# Patient Record
Sex: Female | Born: 2000 | Race: Black or African American | Hispanic: No | Marital: Single | Smoking: Never smoker
Health system: Southern US, Community
[De-identification: ages and names within clinical notes are randomized; demographics above are authoritative.]

## PROBLEM LIST (undated history)

## (undated) DIAGNOSIS — F419 Anxiety disorder, unspecified: Secondary | ICD-10-CM

## (undated) DIAGNOSIS — K219 Gastro-esophageal reflux disease without esophagitis: Secondary | ICD-10-CM

## (undated) DIAGNOSIS — Z8659 Personal history of other mental and behavioral disorders: Secondary | ICD-10-CM

## (undated) DIAGNOSIS — K589 Irritable bowel syndrome without diarrhea: Secondary | ICD-10-CM

## (undated) DIAGNOSIS — L309 Dermatitis, unspecified: Secondary | ICD-10-CM

## (undated) DIAGNOSIS — K602 Anal fissure, unspecified: Secondary | ICD-10-CM

## (undated) DIAGNOSIS — J45909 Unspecified asthma, uncomplicated: Secondary | ICD-10-CM

## (undated) HISTORY — DX: Dermatitis, unspecified: L30.9

## (undated) HISTORY — PX: ADENOIDECTOMY: SHX5191

## (undated) HISTORY — PX: WISDOM TOOTH EXTRACTION: SHX21

## (undated) HISTORY — PX: TONSILLECTOMY: SUR1361

## (undated) HISTORY — DX: Personal history of other mental and behavioral disorders: Z86.59

## (undated) HISTORY — DX: Anal fissure, unspecified: K60.2

## (undated) HISTORY — DX: Gastro-esophageal reflux disease without esophagitis: K21.9

## (undated) HISTORY — PX: ABCESS DRAINAGE: SHX399

## (undated) HISTORY — DX: Anxiety disorder, unspecified: F41.9

---
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
## 2019-01-01 ENCOUNTER — Telehealth: Payer: Self-pay

## 2019-01-01 NOTE — Telephone Encounter (Signed)
Returned call, pt had nexplanon inserted Oct 2018 to control heavy, irregular periods. Pt states that within the last year cycles have been getting heavy again and are irregular throughout the month. Pt states that she wants to keep her nexplanon for now, but wants to discuss options to control bleeding with provider. Advised scheduler will call for appt.

## 2019-01-13 ENCOUNTER — Encounter: Payer: Self-pay | Admitting: Obstetrics and Gynecology

## 2019-01-13 ENCOUNTER — Ambulatory Visit (INDEPENDENT_AMBULATORY_CARE_PROVIDER_SITE_OTHER): Payer: Medicaid Other | Admitting: Obstetrics and Gynecology

## 2019-01-13 DIAGNOSIS — Z975 Presence of (intrauterine) contraceptive device: Secondary | ICD-10-CM

## 2019-01-13 DIAGNOSIS — N921 Excessive and frequent menstruation with irregular cycle: Secondary | ICD-10-CM

## 2019-01-13 MED ORDER — NORGESTIMATE-ETH ESTRADIOL 0.25-35 MG-MCG PO TABS: 1.0000 | ORAL_TABLET | Freq: Every day | ORAL | 4 refills | Status: DC

## 2019-01-13 NOTE — Progress Notes (Signed)
S/w pt to prep for webex visit. Pt states that nexplanon was place in 2018 for heavy, irregular bleeding, within last year cycles are returning and are lasting longer. Pt does not want nexplanon removed, but wants to discuss options.

## 2019-01-13 NOTE — Progress Notes (Signed)
    TELEHEALTH GYNECOLOGY VIRTUAL VIDEO VISIT ENCOUNTER NOTE  Provider location: [facility] [place]   I connected with Patricia Davenport on 01/13/19 at  4:15 PM EDT by WebEx Video Encounter at home and verified that I am speaking with the correct person using two identifiers.   I discussed the limitations, risks, security and privacy concerns of performing an evaluation and management service by telephone and the availability of in person appointments. I also discussed with the patient that there may be a patient responsible charge related to this service. The patient expressed understanding and agreed to proceed.   History:  Patricia Davenport is a 18 y.o. G0P0000 female being evaluated today for DUB associated with Nexplanon. Patient reports the return of heavy irregular cycle lasting 2 weeks long. Nexplanon was placed 04/2017. She is sexually active. She denies any abnormal vaginal discharge, pelvic pain, or other concerns.       Past Medical History:  Diagnosis Date  . Anxiety   . Asthma    childhood  . Eczema   . History of anxiety    Past Surgical History:  Procedure Laterality Date  . ABCESS DRAINAGE    . ADENOIDECTOMY    . TONSILLECTOMY    . WISDOM TOOTH EXTRACTION     The following portions of the patient's history were reviewed and updated as appropriate: allergies, current medications, past family history, past medical history, past social history, past surgical history and problem list.    Review of Systems:  Pertinent items noted in HPI and remainder of comprehensive ROS otherwise negative.  Physical Exam:   General:  Alert, oriented and cooperative. Patient appears to be in no acute distress.  Mental Status: Normal mood and affect. Normal behavior. Normal judgment and thought content.   Respiratory: Normal respiratory effort, no problems with respiration noted  Rest of physical exam deferred due to type of encounter  Labs and Imaging No results found  for this or any previous visit (from the past 336 hour(s)). No results found.     Assessment and Plan:     DUB with Nexplanon.     Discussed irregular bleeding pattern expected with Nexplanon Rx Sprintec provided to offer some regularity in her bleeding profile Encouraged the use of condoms with every sexual encounter  I discussed the assessment and treatment plan with the patient. The patient was provided an opportunity to ask questions and all were answered. The patient agreed with the plan and demonstrated an understanding of the instructions.   The patient was advised to call back or seek an in-person evaluation/go to the ED if the symptoms worsen or if the condition fails to improve as anticipated.  I provided 11 minutes of face-to-face time during this encounter.   Mora Bellman, MD [facility] [place]

## 2020-02-26 ENCOUNTER — Ambulatory Visit
Admission: EM | Admit: 2020-02-26 | Discharge: 2020-02-26 | Disposition: A | Discharge status: Home / Self Care | Payer: Medicaid Other | Attending: Emergency Medicine | Admitting: Emergency Medicine

## 2020-02-26 ENCOUNTER — Other Ambulatory Visit: Payer: Self-pay

## 2020-02-26 ENCOUNTER — Encounter: Payer: Self-pay | Admitting: Emergency Medicine

## 2020-02-26 DIAGNOSIS — Z20822 Contact with and (suspected) exposure to covid-19: Secondary | ICD-10-CM | POA: Diagnosis present

## 2020-02-26 DIAGNOSIS — R0789 Other chest pain: Secondary | ICD-10-CM | POA: Diagnosis present

## 2020-02-26 DIAGNOSIS — R300 Dysuria: Secondary | ICD-10-CM | POA: Insufficient documentation

## 2020-02-26 DIAGNOSIS — K219 Gastro-esophageal reflux disease without esophagitis: Secondary | ICD-10-CM | POA: Insufficient documentation

## 2020-02-26 LAB — POCT URINALYSIS DIP (MANUAL ENTRY)
Bilirubin, UA: NEGATIVE
Glucose, UA: NEGATIVE mg/dL
Ketones, POC UA: NEGATIVE mg/dL
Nitrite, UA: NEGATIVE
Protein Ur, POC: NEGATIVE mg/dL
Spec Grav, UA: >=1.03 — AB (ref 1.010–1.025)
Urobilinogen, UA: 0.2 E.U./dL
pH, UA: 6 (ref 5.0–8.0)

## 2020-02-26 LAB — POCT URINE PREGNANCY: Preg Test, Ur: NEGATIVE

## 2020-02-26 MED ORDER — CEPHALEXIN 500 MG PO CAPS: 500.0000 mg | ORAL_CAPSULE | Freq: Two times a day (BID) | ORAL | 0 refills | Status: AC

## 2020-02-26 MED ORDER — PANTOPRAZOLE SODIUM 20 MG PO TBEC: 20.0000 mg | DELAYED_RELEASE_TABLET | Freq: Every day | ORAL | 0 refills | Status: AC

## 2020-02-26 NOTE — ED Triage Notes (Signed)
Pt presents to [facility] for assessment of urinary urgency and frequency, nausea and 5 episodes of emesis in the last 24 hours, right lower pain, "I think" vaginal discharge, and chest pain.  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  Pt c/o pain under left breast, and midsternal.  Patient states the ibuprofen she took for her abdominal pain did not help her chest pain.  Denies shortness of breath.  C/o right forearm and shoulder pain.  Denies jaw or back pain.

## 2020-02-26 NOTE — Discharge Instructions (Addendum)
Take antibiotic twice daily with food. Important to drink plenty of water throughout the day. Return for worsening urinary symptoms, blood in urine, abdominal or back pain, fever.  Go to ER for worsening chest pain, difficulty breathing, lightheadedness, weakness, vomiting.

## 2020-02-26 NOTE — ED Provider Notes (Signed)
[facility]    CSN: [id number] Arrival date & time: 02/26/20  0912      History   Chief Complaint Chief Complaint  Patient presents with  . Dysuria  . Chest Pain    HPI Patricia Davenport is a 19 y.o. female.   Subjective:  Patricia Davenport is a 19 y.o. female who presents for evaluation of chest pain. Onset was 3 days ago. Symptoms have been unchanged since that time. The patient describes the pain as sharp and radiates to the sternum. Associated symptoms are: none. Aggravating factors are: deep inspiration. Alleviating factors are: none. Patient's cardiac risk factors are: none. Patient's risk factors for DVT/PE: none. Previous cardiac testing: none.  Does endorse h/o GERD: > 2 times a week.  Not taking anything for this.  The following portions of the patient's history were reviewed and updated as appropriate: allergies, current medications, past family history, past medical history, past social history, past surgical history and problem list.   Patient also notes dysuria, frequency x 1 day.  No pelvic/vaginal pain, concern for STI.  Also requesting Covid testing due to exposure at work.     Past Medical History:  Diagnosis Date  . Anxiety   . Asthma    childhood  . Eczema   . History of anxiety     Patient Active Problem List   Diagnosis Date Noted  . Nausea with vomiting 06/03/2018  . Obesity 06/03/2018    Past Surgical History:  Procedure Laterality Date  . ABCESS DRAINAGE    . ADENOIDECTOMY    . TONSILLECTOMY    . WISDOM TOOTH EXTRACTION      OB History    Gravida  0   Para  0   Term  0   Preterm  0   AB  0   Living  0     SAB  0   TAB  0   Ectopic  0   Multiple  0   Live Births  0            Home Medications    Prior to Admission medications   Medication Sig Start Date End Date Taking? Authorizing Provider  cephALEXin (KEFLEX) 500 MG capsule Take 1 capsule (500 mg total) by mouth 2 (two) times daily for 3 days.  02/26/20 02/29/20  Hall-Potvin, [place], PA-C  doxycycline (VIBRAMYCIN) 100 MG capsule TAKE 1 CAPSULE BY MOUTH TWICE A DAY FOR 1 WEEK FOR SKIN INFECTION 12/03/18   [provider]  etonogestrel (NEXPLANON) 68 MG IMPL implant 1 each by Subdermal route once.    [provider]  pantoprazole (PROTONIX) 20 MG tablet Take 1 tablet (20 mg total) by mouth daily. 02/26/20   Hall-Potvin, [place], PA-C  rizatriptan (MAXALT-MLT) 5 MG disintegrating tablet PLEASE SEE ATTACHED FOR DETAILED DIRECTIONS 08/05/18   [provider]  escitalopram (LEXAPRO) 10 MG tablet TAKE 1 TABLET BY MOUTH DAILY FOR ANXIETY 08/19/18 02/26/20  [provider]  norgestimate-ethinyl estradiol (ORTHO-CYCLEN) 0.25-35 MG-MCG tablet Take 1 tablet by mouth daily. 01/13/19 02/26/20  Constant, Peggy, MD  propranolol ER (INDERAL LA) 60 MG 24 hr capsule TAKE 1 CAPSULE BY MOUTH EVERY MORNING FOR PREVENTION OF MIGRAINE SYMPTOMS 06/26/18 02/26/20  [provider]    Family History Family History  Problem Relation Age of Onset  . Thyroid disease Mother   . GER disease Mother   . Hypertension Father   . GER disease Father   . Hypertension Maternal Grandmother   .  Diabetes Maternal Grandmother   . Hypertension Paternal Grandmother   . Hypertension Paternal Grandfather   . Irritable bowel syndrome Neg Hx   . Colitis Neg Hx     Social History Social History   Tobacco Use  . Smoking status: Never Smoker  . Smokeless tobacco: Never Used  Vaping Use  . Vaping Use: Never used  Substance Use Topics  . Alcohol use: No  . Drug use: No     Allergies   Patient has no known allergies.   Review of Systems As per HPI   Physical Exam Triage Vital Signs ED Triage Vitals  Enc Vitals Group     BP      Pulse      Resp      Temp      Temp src      SpO2      Weight      Height      Head Circumference      Peak Flow      Pain Score      Pain Loc      Pain Edu?      Excl. in GC?    No data  found.  Updated Vital Signs BP 113/74 (BP Location: Left Arm)   Pulse 82   Temp 98.4 F (36.9 C) (Oral)   Resp 18   LMP 01/22/2020   SpO2 94%   Visual Acuity Right Eye Distance:   Left Eye Distance:   Bilateral Distance:    Right Eye Near:   Left Eye Near:    Bilateral Near:     Physical Exam Constitutional:      General: She is not in acute distress.    Appearance: She is obese. She is not ill-appearing or diaphoretic.  HENT:     Head: Normocephalic and atraumatic.     Mouth/Throat:     Mouth: Mucous membranes are moist.     Pharynx: Oropharynx is clear. No oropharyngeal exudate or posterior oropharyngeal erythema.  Eyes:     General: No scleral icterus.    Conjunctiva/sclera: Conjunctivae normal.     Pupils: Pupils are equal, round, and reactive to light.  Neck:     Comments: Trachea midline, negative JVD Cardiovascular:     Rate and Rhythm: Normal rate and regular rhythm.     Heart sounds: No murmur heard.  No gallop.   Pulmonary:     Effort: Pulmonary effort is normal. No respiratory distress.     Breath sounds: No wheezing, rhonchi or rales.  Chest:     Chest wall: Tenderness present. No mass, deformity, crepitus or edema.     Comments: Sternal Musculoskeletal:     Cervical back: Neck supple. No tenderness.  Lymphadenopathy:     Cervical: No cervical adenopathy.  Skin:    Capillary Refill: Capillary refill takes less than 2 seconds.     Coloration: Skin is not jaundiced or pale.     Findings: No rash.  Neurological:     General: No focal deficit present.     Mental Status: She is alert and oriented to person, place, and time.      UC Treatments / Results  Labs (all labs ordered are listed, but only abnormal results are displayed) Labs Reviewed  POCT URINALYSIS DIP (MANUAL ENTRY) - Abnormal; Notable for the following components:      Result Value   Spec Grav, UA >=1.030 (*)    Blood, UA trace-intact (*)    Leukocytes,  UA Small (1+) (*)    All  other components within normal limits  URINE CULTURE  NOVEL CORONAVIRUS, NAA  POCT URINE PREGNANCY    EKG   Radiology No results found.  Procedures Procedures (including critical care time)  Medications Ordered in UC Medications - No data to display  Initial Impression / Assessment and Plan / UC Course  I have reviewed the triage vital signs and the nursing notes.  Pertinent labs & imaging results that were available during my care of the patient were reviewed by me and considered in my medical decision making (see chart for details).     Patient afebrile, nontoxic, with SpO2 94%.  EKG done office, reviewed by me and compared to previous from 08/04/2018: NSR with sinus arrhythmia.  Ventricular rate 79 bpm.  No QTC pronation, ST elevation or depression.  No waveform changes in all leads.  Nonacute EKG.  Reviewed findings with patient verbalized understanding.  Urine pregnancy negative, urine dipstick with leukocytes, blood, elevated specific gravity.  Culture pending.  Will start Keflex in the interim.  Covid PCR pending.  Patient to quarantine until results are back.  We will treat supportively as outlined below.  Return precautions discussed, patient verbalized understanding and is agreeable to plan. Final Clinical Impressions(s) / UC Diagnoses   Final diagnoses:  Encounter for laboratory testing for COVID-19 virus  Exposure to COVID-19 virus  Dysuria  Other chest pain  Gastroesophageal reflux disease, unspecified whether esophagitis present     Discharge Instructions     Take antibiotic twice daily with food. Important to drink plenty of water throughout the day. Return for worsening urinary symptoms, blood in urine, abdominal or back pain, fever.  Go to ER for worsening chest pain, difficulty breathing, lightheadedness, weakness, vomiting.    ED Prescriptions    Medication Sig Dispense Auth. Provider   pantoprazole (PROTONIX) 20 MG tablet Take 1 tablet (20 mg  total) by mouth daily. 30 tablet Hall-Potvin, [place], PA-C   cephALEXin (KEFLEX) 500 MG capsule Take 1 capsule (500 mg total) by mouth 2 (two) times daily for 3 days. 6 capsule Hall-Potvin, [place], PA-C     PDMP not reviewed this encounter.   Hall-Potvin, [place], [place] 02/26/20 1005

## 2020-02-27 LAB — NOVEL CORONAVIRUS, NAA: SARS-CoV-2, NAA: NOT DETECTED

## 2020-02-27 LAB — SARS-COV-2, NAA 2 DAY TAT

## 2020-02-28 LAB — URINE CULTURE: Culture: >=100000 — AB

## 2020-03-04 ENCOUNTER — Encounter (HOSPITAL_COMMUNITY): Payer: Self-pay | Admitting: *Deleted

## 2020-03-04 ENCOUNTER — Emergency Department (HOSPITAL_COMMUNITY): Payer: Medicaid Other

## 2020-03-04 ENCOUNTER — Other Ambulatory Visit: Payer: Self-pay

## 2020-03-04 ENCOUNTER — Emergency Department (HOSPITAL_COMMUNITY)
Admission: EM | Admit: 2020-03-04 | Discharge: 2020-03-04 | Disposition: A | Discharge status: Home / Self Care | Payer: Medicaid Other | Attending: Emergency Medicine | Admitting: Emergency Medicine

## 2020-03-04 DIAGNOSIS — R079 Chest pain, unspecified: Secondary | ICD-10-CM | POA: Insufficient documentation

## 2020-03-04 DIAGNOSIS — K219 Gastro-esophageal reflux disease without esophagitis: Secondary | ICD-10-CM

## 2020-03-04 DIAGNOSIS — J45909 Unspecified asthma, uncomplicated: Secondary | ICD-10-CM | POA: Insufficient documentation

## 2020-03-04 DIAGNOSIS — R1012 Left upper quadrant pain: Secondary | ICD-10-CM | POA: Diagnosis not present

## 2020-03-04 DIAGNOSIS — R1013 Epigastric pain: Secondary | ICD-10-CM | POA: Insufficient documentation

## 2020-03-04 LAB — CBC
HCT: 38.6 % (ref 36.0–46.0)
Hemoglobin: 12.4 g/dL (ref 12.0–15.0)
MCH: 29.2 pg (ref 26.0–34.0)
MCHC: 32.1 g/dL (ref 30.0–36.0)
MCV: 91 fL (ref 80.0–100.0)
Platelets: 333 10*3/uL (ref 150–400)
RBC: 4.24 MIL/uL (ref 3.87–5.11)
RDW: 12.2 % (ref 11.5–15.5)
WBC: 5.6 10*3/uL (ref 4.0–10.5)
nRBC: 0 % (ref 0.0–0.2)

## 2020-03-04 LAB — BASIC METABOLIC PANEL
Anion gap: 10 (ref 5–15)
BUN: 14 mg/dL (ref 6–20)
CO2: 22 mmol/L (ref 22–32)
Calcium: 9.8 mg/dL (ref 8.9–10.3)
Chloride: 107 mmol/L (ref 98–111)
Creatinine, Ser: 0.93 mg/dL (ref 0.44–1.00)
GFR calc Af Amer: >60 mL/min (ref >60)
GFR calc non Af Amer: >60 mL/min (ref >60)
Glucose, Bld: 90 mg/dL (ref 70–99)
Potassium: 3.9 mmol/L (ref 3.5–5.1)
Sodium: 139 mmol/L (ref 135–145)

## 2020-03-04 LAB — I-STAT BETA HCG BLOOD, ED (MC, WL, AP ONLY): I-stat hCG, quantitative: <5 m[IU]/mL (ref <5)

## 2020-03-04 LAB — TROPONIN I (HIGH SENSITIVITY)
Troponin I (High Sensitivity): <2 ng/L (ref <18)
Troponin I (High Sensitivity): <2 ng/L (ref <18)

## 2020-03-04 MED ORDER — HYOSCYAMINE SULFATE 0.125 MG SL SUBL
0.2500 mg | SUBLINGUAL_TABLET | Freq: Once | SUBLINGUAL | Status: AC
  Administered 2020-03-04: 0.25 mg via SUBLINGUAL
  Filled 2020-03-04: qty 2

## 2020-03-04 MED ORDER — SUCRALFATE 1 G PO TABS
1.0000 g | ORAL_TABLET | Freq: Three times a day (TID) | ORAL | Status: DC
  Administered 2020-03-04: 1 g via ORAL
  Filled 2020-03-04: qty 1

## 2020-03-04 MED ORDER — FAMOTIDINE 20 MG PO TABS
40.0000 mg | ORAL_TABLET | Freq: Once | ORAL | Status: AC
  Administered 2020-03-04: 40 mg via ORAL
  Filled 2020-03-04: qty 2

## 2020-03-04 NOTE — ED Notes (Signed)
Pt discharge instructions reviewed with the patient. The patient verbalized understanding of instructions. Pt discharged. 

## 2020-03-04 NOTE — ED Provider Notes (Signed)
[facility] EMERGENCY DEPARTMENT Provider Note   CSN: [id number] Arrival date & time: 03/04/20  6295     History Chief Complaint  Patient presents with  . Chest Pain    Patricia Davenport is a 19 y.o. female.  19 year old female presents with epigastric pain going to left upper quadrant that is worse after she eats food.  Seen recently for similar symptoms placed on medications for GERD but did not get them filled.  Denies any anginal or PE, symptoms.  No fever cough or congestion.  Symptoms are made worse with greasy food in particular.  No treatment use prior to arrival        Past Medical History:  Diagnosis Date  . Anxiety   . Asthma    childhood  . Eczema   . History of anxiety     Patient Active Problem List   Diagnosis Date Noted  . Nausea with vomiting 06/03/2018  . Obesity 06/03/2018    Past Surgical History:  Procedure Laterality Date  . ABCESS DRAINAGE    . ADENOIDECTOMY    . TONSILLECTOMY    . WISDOM TOOTH EXTRACTION       OB History    Gravida  0   Para  0   Term  0   Preterm  0   AB  0   Living  0     SAB  0   TAB  0   Ectopic  0   Multiple  0   Live Births  0           Family History  Problem Relation Age of Onset  . Thyroid disease Mother   . GER disease Mother   . Hypertension Father   . GER disease Father   . Hypertension Maternal Grandmother   . Diabetes Maternal Grandmother   . Hypertension Paternal Grandmother   . Hypertension Paternal Grandfather   . Irritable bowel syndrome Neg Hx   . Colitis Neg Hx     Social History   Tobacco Use  . Smoking status: Never Smoker  . Smokeless tobacco: Never Used  Vaping Use  . Vaping Use: Never used  Substance Use Topics  . Alcohol use: No  . Drug use: No    Home Medications Prior to Admission medications   Medication Sig Start Date End Date Taking? Authorizing Provider  doxycycline (VIBRAMYCIN) 100 MG capsule TAKE 1 CAPSULE BY MOUTH TWICE A DAY  FOR 1 WEEK FOR SKIN INFECTION 12/03/18   [provider]  etonogestrel (NEXPLANON) 68 MG IMPL implant 1 each by Subdermal route once.    [provider]  pantoprazole (PROTONIX) 20 MG tablet Take 1 tablet (20 mg total) by mouth daily. 02/26/20   Hall-Potvin, [place], PA-C  rizatriptan (MAXALT-MLT) 5 MG disintegrating tablet PLEASE SEE ATTACHED FOR DETAILED DIRECTIONS 08/05/18   [provider]  escitalopram (LEXAPRO) 10 MG tablet TAKE 1 TABLET BY MOUTH DAILY FOR ANXIETY 08/19/18 02/26/20  [provider]  norgestimate-ethinyl estradiol (ORTHO-CYCLEN) 0.25-35 MG-MCG tablet Take 1 tablet by mouth daily. 01/13/19 02/26/20  Constant, Peggy, MD  propranolol ER (INDERAL LA) 60 MG 24 hr capsule TAKE 1 CAPSULE BY MOUTH EVERY MORNING FOR PREVENTION OF MIGRAINE SYMPTOMS 06/26/18 02/26/20  [provider]    Allergies    Patient has no known allergies.  Review of Systems   Review of Systems  All other systems reviewed and are negative.   Physical Exam Updated Vital Signs BP 112/60 (BP Location:  Right Arm)   Pulse 67   Temp 98.4 F (36.9 C) (Oral)   Resp 18   SpO2 100%   Physical Exam Vitals and nursing note reviewed.  Constitutional:      General: She is not in acute distress.    Appearance: Normal appearance. She is well-developed. She is not toxic-appearing.  HENT:     Head: Normocephalic and atraumatic.  Eyes:     General: Lids are normal.     Conjunctiva/sclera: Conjunctivae normal.     Pupils: Pupils are equal, round, and reactive to light.  Neck:     Thyroid: No thyroid mass.     Trachea: No tracheal deviation.  Cardiovascular:     Rate and Rhythm: Normal rate and regular rhythm.     Heart sounds: Normal heart sounds. No murmur heard.  No gallop.   Pulmonary:     Effort: Pulmonary effort is normal. No respiratory distress.     Breath sounds: Normal breath sounds. No stridor. No decreased breath sounds, wheezing, rhonchi or rales.  Abdominal:       General: Bowel sounds are normal. There is no distension.     Palpations: Abdomen is soft.     Tenderness: There is no abdominal tenderness. There is no rebound.    Musculoskeletal:        General: No tenderness. Normal range of motion.     Cervical back: Normal range of motion and neck supple.  Skin:    General: Skin is warm and dry.     Findings: No abrasion or rash.  Neurological:     Mental Status: She is alert and oriented to person, place, and time.     GCS: GCS eye subscore is 4. GCS verbal subscore is 5. GCS motor subscore is 6.     Cranial Nerves: No cranial nerve deficit.     Sensory: No sensory deficit.  Psychiatric:        Speech: Speech normal.        Behavior: Behavior normal.     ED Results / Procedures / Treatments   Labs (all labs ordered are listed, but only abnormal results are displayed) Labs Reviewed  BASIC METABOLIC PANEL  CBC  I-STAT BETA HCG BLOOD, ED ([facility], [facility], [facility] ONLY)  TROPONIN I (HIGH SENSITIVITY)  TROPONIN I (HIGH SENSITIVITY)    EKG EKG Interpretation  Date/Time:  Thursday March 04 2020 33:29:51 EDT Ventricular Rate:  75 PR Interval:  162 QRS Duration: 72 QT Interval:  354 QTC Calculation: 395 R Axis:   66 Text Interpretation: Sinus rhythm with marked sinus arrhythmia Otherwise normal ECG No significant change since last tracing Confirmed by Lorre Nick ([id number]) on 03/04/2020 12:15:23 PM   Radiology DG Chest 2 View  Result Date: 03/04/2020 CLINICAL DATA:  Patient with left-sided chest pain. EXAM: CHEST - 2 VIEW COMPARISON:  Chest radiograph 04/28/2004. FINDINGS: The heart size and mediastinal contours are within normal limits. Both lungs are clear. The visualized skeletal structures are unremarkable. IMPRESSION: No active cardiopulmonary disease. Electronically Signed   By: Annia Belt M.D.   On: 03/04/2020 09:43    Procedures Procedures (including critical care time)  Medications Ordered in ED Medications  hyoscyamine  (LEVSIN SL) SL tablet 0.25 mg (has no administration in time range)  sucralfate (CARAFATE) tablet 1 g (has no administration in time range)  famotidine (PEPCID) tablet 40 mg (has no administration in time range)    ED Course  I have reviewed the triage vital signs and  the nursing notes.  Pertinent labs & imaging results that were available during my care of the patient were reviewed by me and considered in my medical decision making (see chart for details).    MDM Rules/Calculators/A&P                         Work-up for ACS negative here.  No suspicion for PE. Patient with likely GERD and will treat for that here.  Have encouraged patient to get her medications filled. Final Clinical Impression(s) / ED Diagnoses Final diagnoses:  None    Rx / DC Orders ED Discharge Orders    None       Lorre Nick, MD 03/04/20 1230

## 2020-03-04 NOTE — ED Triage Notes (Signed)
Pt reports left side chest pain since last night. Hx of same and was seen at [facility] recently for same. Reports unable to get the meds prescribed for acid reflux. No distress is noted at triage.

## 2020-03-15 ENCOUNTER — Ambulatory Visit
Admission: EM | Admit: 2020-03-15 | Discharge: 2020-03-15 | Disposition: A | Discharge status: Home / Self Care | Payer: Medicaid Other

## 2020-03-15 DIAGNOSIS — K649 Unspecified hemorrhoids: Secondary | ICD-10-CM | POA: Diagnosis not present

## 2020-03-15 MED ORDER — POLYETHYLENE GLYCOL 3350 17 G PO PACK: 17.0000 g | PACK | Freq: Every day | ORAL | 0 refills | Status: DC

## 2020-03-15 MED ORDER — HYDROCORTISONE (PERIANAL) 2.5 % EX CREA
1.0000 | TOPICAL_CREAM | Freq: Two times a day (BID) | CUTANEOUS | 0 refills | Status: DC
Start: 2020-03-15 — End: 2020-09-24

## 2020-03-15 MED ORDER — HYDROCORTISONE ACETATE 25 MG RE SUPP
25.0000 mg | Freq: Two times a day (BID) | RECTAL | 0 refills | Status: DC
Start: 2020-03-15 — End: 2020-09-24

## 2020-03-15 NOTE — Discharge Instructions (Signed)
Medication as prescribed Patient is a complaining of water.  Follow-up with your doctor as needed

## 2020-03-15 NOTE — ED Provider Notes (Signed)
[facility]    CSN: [id number] Arrival date & time: 03/15/20  1015      History   Chief Complaint No chief complaint on file.   HPI Keta Vanvalkenburgh is a 19 y.o. female.   Patient is a 19 year old female presents today for concern for hemorrhoid.  She has had rectal pain x1 week.  History of anal fissures.  Streaks of blood with stool due to straining.  History of constipation.  Reports relief when she wipes her bottom x1 week and pain with sitting and wiping.  Last bout of it 2 days ago.  She does drink any water.  Has not had anything for her symptoms.  Has used suppositories in the past.  No abdominal pain, back pain, nausea, vomiting or diarrhea.     Past Medical History:  Diagnosis Date   Anxiety    Asthma    childhood   Eczema    History of anxiety     Patient Active Problem List   Diagnosis Date Noted   Nausea with vomiting 06/03/2018   Obesity 06/03/2018    Past Surgical History:  Procedure Laterality Date   ABCESS DRAINAGE     ADENOIDECTOMY     TONSILLECTOMY     WISDOM TOOTH EXTRACTION      OB History    Gravida  0   Para  0   Term  0   Preterm  0   AB  0   Living  0     SAB  0   TAB  0   Ectopic  0   Multiple  0   Live Births  0            Home Medications    Prior to Admission medications   Medication Sig Start Date End Date Taking? Authorizing Provider  sertraline (ZOLOFT) 100 MG tablet Take 100 mg by mouth daily.   Yes [provider]  VALACYCLOVIR HCL PO Take by mouth.   Yes [provider]  doxycycline (VIBRAMYCIN) 100 MG capsule TAKE 1 CAPSULE BY MOUTH TWICE A DAY FOR 1 WEEK FOR SKIN INFECTION 12/03/18   [provider]  etonogestrel (NEXPLANON) 68 MG IMPL implant 1 each by Subdermal route once.    [provider]  hydrocortisone (ANUSOL-HC) 2.5 % rectal cream Place 1 application rectally 2 (two) times daily. 03/15/20   Dahlia Byes A, NP  hydrocortisone (ANUSOL-HC)  25 MG suppository Place 1 suppository (25 mg total) rectally 2 (two) times daily. 03/15/20   Dahlia Byes A, NP  pantoprazole (PROTONIX) 20 MG tablet Take 1 tablet (20 mg total) by mouth daily. 02/26/20   Hall-Potvin, [place], PA-C  polyethylene glycol (MIRALAX / GLYCOLAX) 17 g packet Take 17 g by mouth daily. 03/15/20   Dahlia Byes A, NP  rizatriptan (MAXALT-MLT) 5 MG disintegrating tablet PLEASE SEE ATTACHED FOR DETAILED DIRECTIONS 08/05/18   [provider]  escitalopram (LEXAPRO) 10 MG tablet TAKE 1 TABLET BY MOUTH DAILY FOR ANXIETY 08/19/18 02/26/20  [provider]  norgestimate-ethinyl estradiol (ORTHO-CYCLEN) 0.25-35 MG-MCG tablet Take 1 tablet by mouth daily. 01/13/19 02/26/20  Constant, Peggy, MD  propranolol ER (INDERAL LA) 60 MG 24 hr capsule TAKE 1 CAPSULE BY MOUTH EVERY MORNING FOR PREVENTION OF MIGRAINE SYMPTOMS 06/26/18 02/26/20  [provider]    Family History Family History  Problem Relation Age of Onset   Thyroid disease Mother    GER disease Mother    Hypertension Father    GER  disease Father    Hypertension Maternal Grandmother    Diabetes Maternal Grandmother    Hypertension Paternal Grandmother    Hypertension Paternal Grandfather    Irritable bowel syndrome Neg Hx    Colitis Neg Hx     Social History Social History   Tobacco Use   Smoking status: Never Smoker   Smokeless tobacco: Never Used  Building services engineer Use: Never used  Substance Use Topics   Alcohol use: No   Drug use: No     Allergies   Patient has no known allergies.   Review of Systems Review of Systems   Physical Exam Triage Vital Signs ED Triage Vitals  Enc Vitals Group     BP 03/15/20 1052 111/72     Pulse Rate 03/15/20 1052 76     Resp 03/15/20 1052 18     Temp 03/15/20 1052 98.5 F (36.9 C)     Temp src --      SpO2 03/15/20 1052 98 %     Weight --      Height --      Head Circumference --      Peak Flow --      Pain Score 03/15/20 1050  7     Pain Loc --      Pain Edu? --      Excl. in GC? --    No data found.  Updated Vital Signs BP 111/72    Pulse 76    Temp 98.5 F (36.9 C)    Resp 18    SpO2 98%   Visual Acuity Right Eye Distance:   Left Eye Distance:   Bilateral Distance:    Right Eye Near:   Left Eye Near:    Bilateral Near:     Physical Exam Vitals and nursing note reviewed.  Constitutional:      General: She is not in acute distress.    Appearance: Normal appearance. She is not ill-appearing, toxic-appearing or diaphoretic.  HENT:     Head: Normocephalic.     Nose: Nose normal.  Eyes:     Conjunctiva/sclera: Conjunctivae normal.  Pulmonary:     Effort: Pulmonary effort is normal.  Genitourinary:    Comments: Small external hemorrhoid No bleeding  Musculoskeletal:        General: Normal range of motion.     Cervical back: Normal range of motion.  Skin:    General: Skin is warm and dry.     Findings: No rash.  Neurological:     Mental Status: She is alert.  Psychiatric:        Mood and Affect: Mood normal.      UC Treatments / Results  Labs (all labs ordered are listed, but only abnormal results are displayed) Labs Reviewed - No data to display  EKG   Radiology No results found.  Procedures Procedures (including critical care time)  Medications Ordered in UC Medications - No data to display  Initial Impression / Assessment and Plan / UC Course  I have reviewed the triage vital signs and the nursing notes.  Pertinent labs & imaging results that were available during my care of the patient were reviewed by me and considered in my medical decision making (see chart for details).     Hemorrhoid Treat with Anusol and hydrocortisone rectal cream MiraLAX for constipation Follow up as needed for continued or worsening symptoms  Final Clinical Impressions(s) / UC Diagnoses   Final diagnoses:  Hemorrhoids,  unspecified hemorrhoid type     Discharge Instructions       Medication as prescribed Patient is a complaining of water.  Follow-up with your doctor as needed    ED Prescriptions    Medication Sig Dispense Auth. Provider   polyethylene glycol (MIRALAX / GLYCOLAX) 17 g packet Take 17 g by mouth daily. 14 each Meagan Spease A, NP   hydrocortisone (ANUSOL-HC) 2.5 % rectal cream Place 1 application rectally 2 (two) times daily. 30 g Yatziry Deakins A, NP   hydrocortisone (ANUSOL-HC) 25 MG suppository Place 1 suppository (25 mg total) rectally 2 (two) times daily. 12 suppository Jaiah Weigel A, NP     PDMP not reviewed this encounter.   Janace Aris, NP 03/15/20 1106

## 2020-03-15 NOTE — ED Triage Notes (Signed)
Pt presents with concern for hemorrhoid. Reports she keeps getting "tears in her rectum" per her doctor due to having streaks of blood in her stool due to straining. Reports a lump when she wipes her bottom x 1 week that is painful constantly and worse when sitting or wiping. 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 Reports last bowel movement was 2 days ago. Reports increased straining and decreased stool for a week.

## 2020-04-02 IMAGING — US US ABDOMEN LIMITED
1 series · 14 of 25 positions shown · non-contrast
Comparison: CT abdomen pelvis 02/18/2014

CLINICAL DATA: Nausea and vomiting, epigastric abdominal pain

EXAM:
ULTRASOUND ABDOMEN LIMITED RIGHT UPPER QUADRANT

[Series 1: us abdomen limited · 0.25mm/px · 14 of 41 slices shown]
[im 1/41]
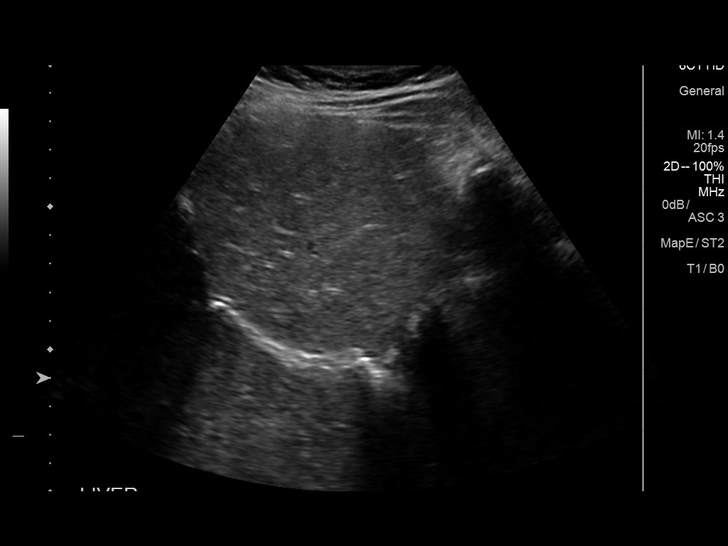
[im 4/41]
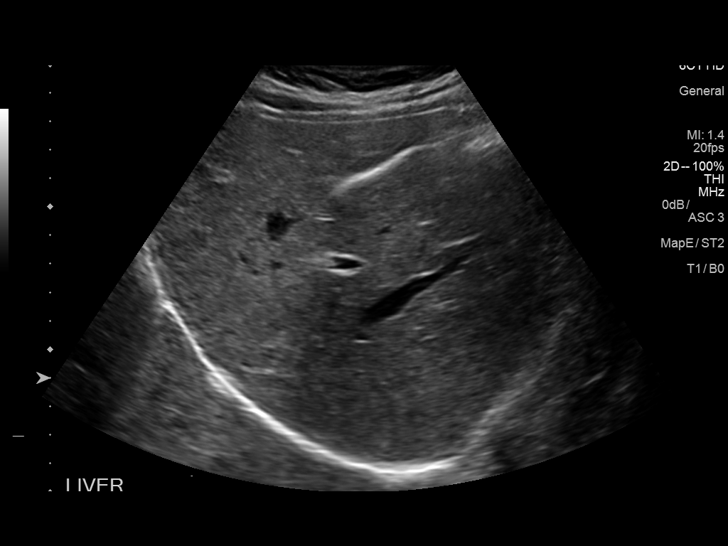
[im 7/41]
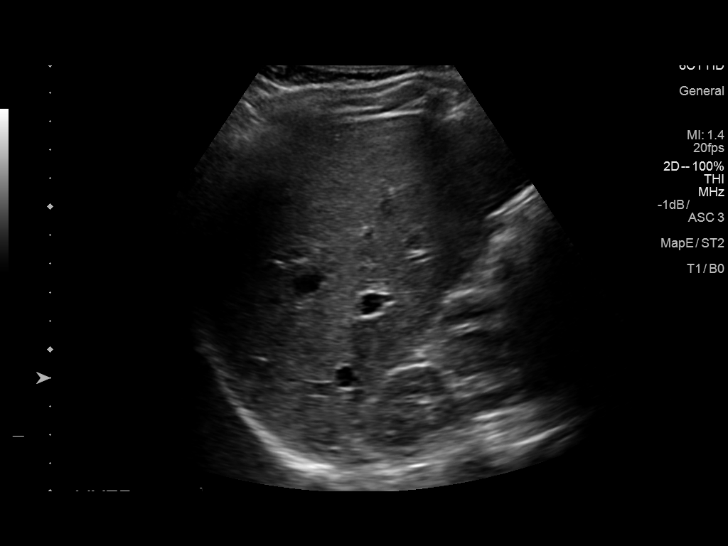
[im 11/41]
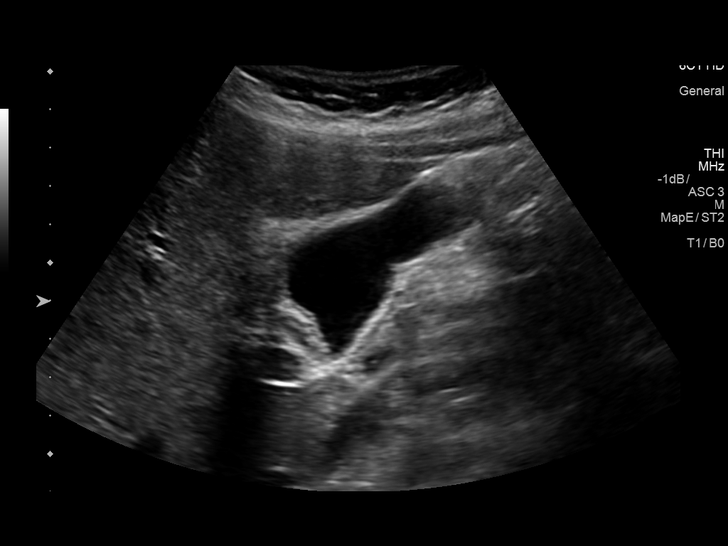
[im 14/41]
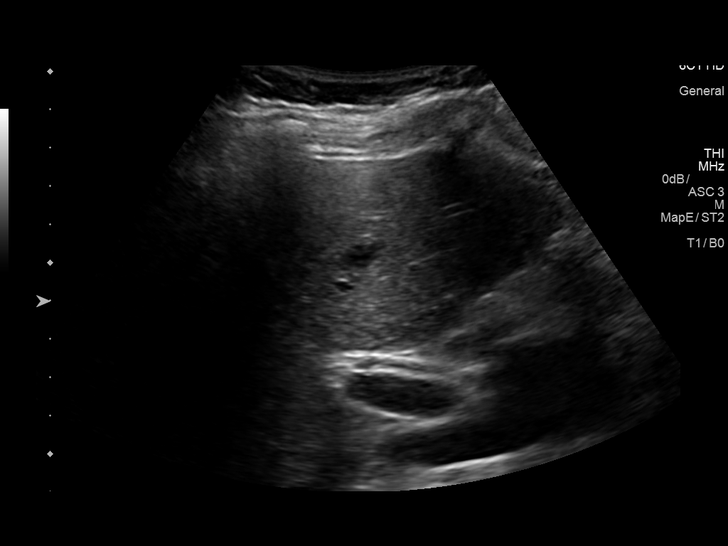
[im 16/41]
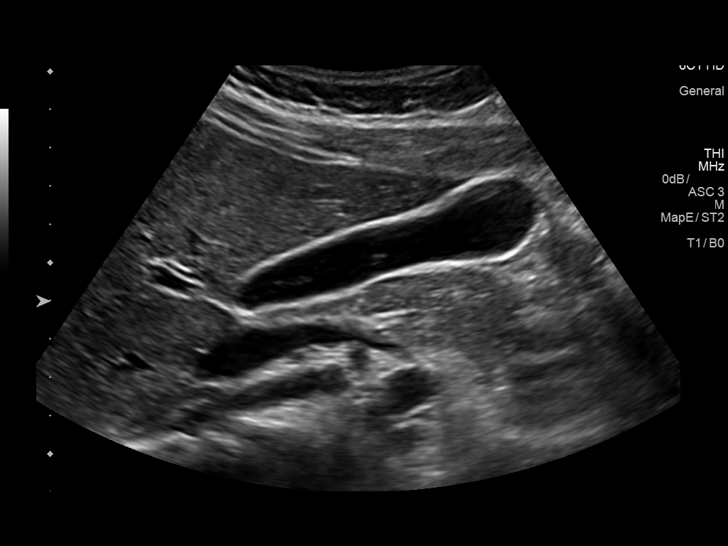
[im 19/41]
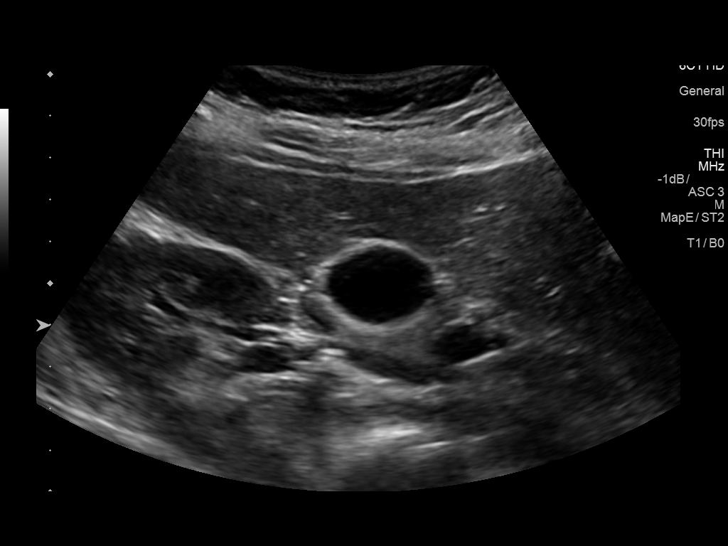
[im 22/41]
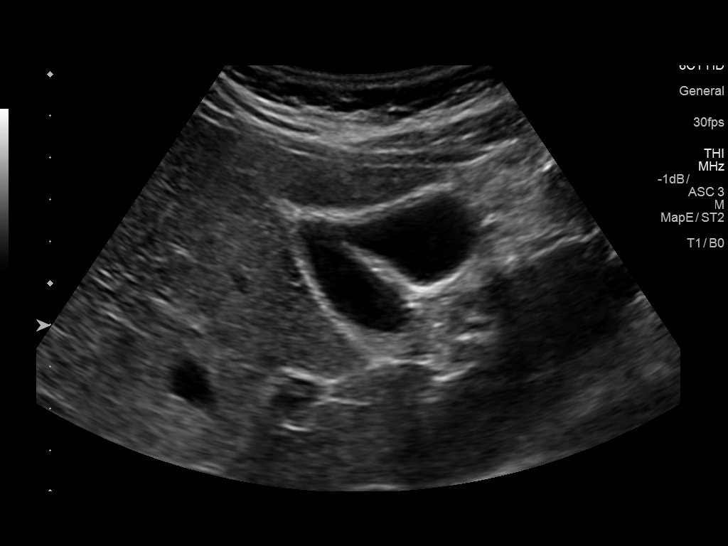
[im 26/41]
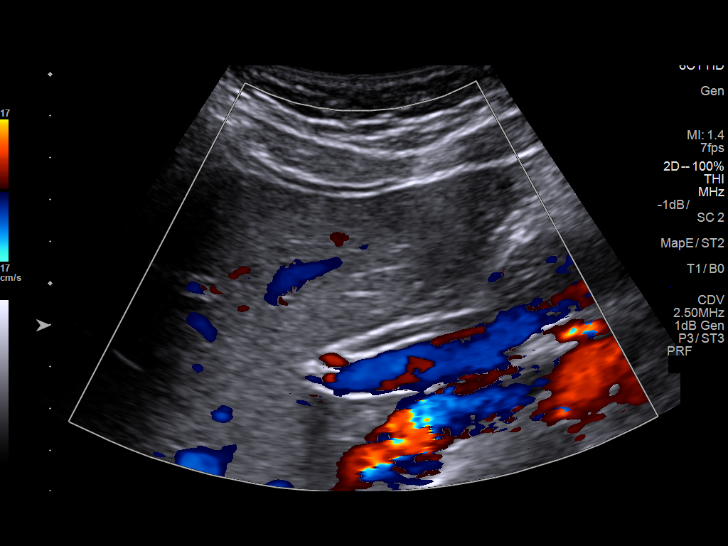
[im 27/41]
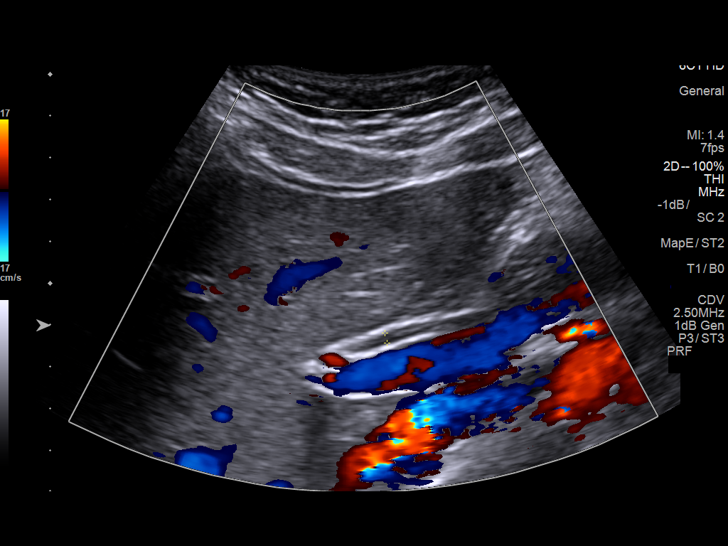
[im 31/41]
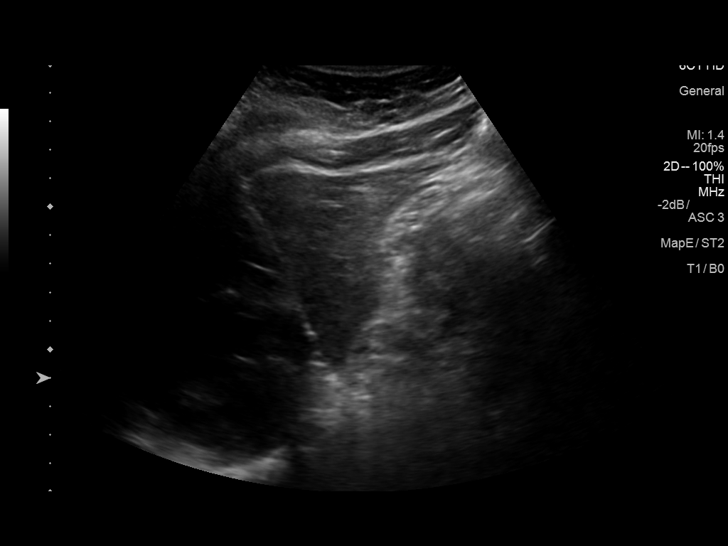
[im 34/41]
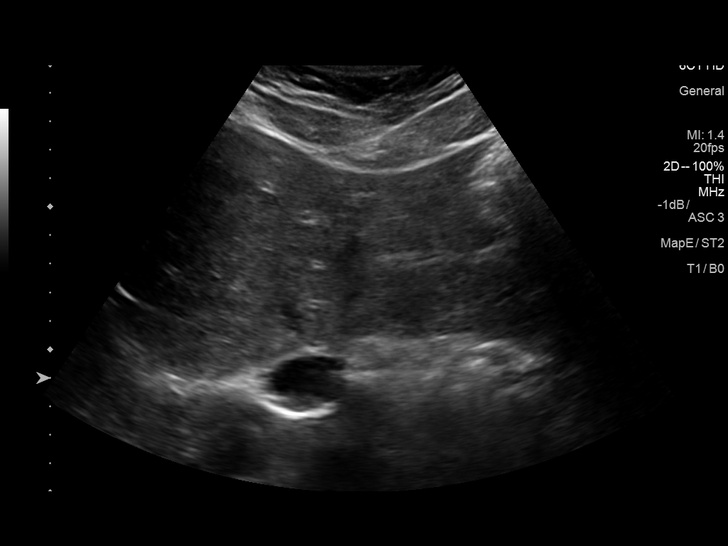
[im 37/41]
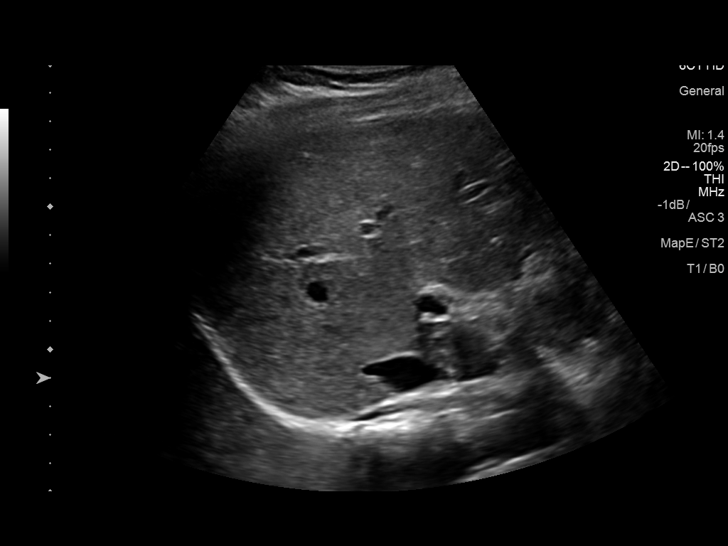
[im 41/41]
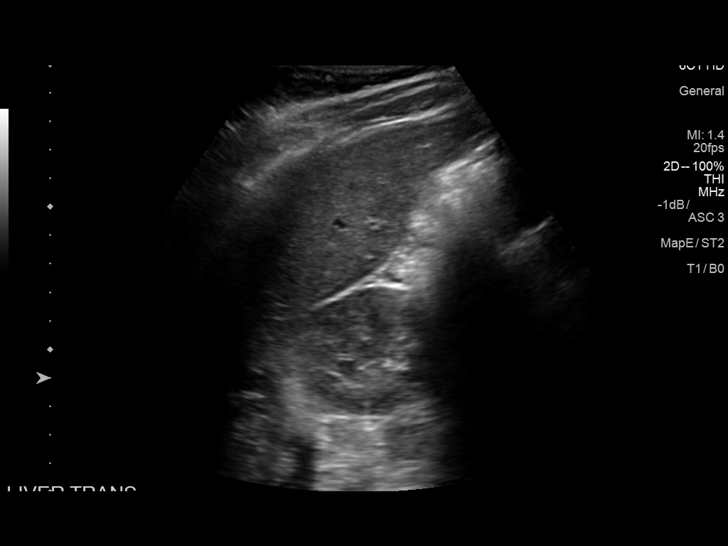

[14 of 25 positions shown; findings below may reference images not displayed]

FINDINGS: Gallbladder:

The gallbladder is visualized and no gallstones are noted. There is
no pain over the gallbladder with compression.

Common bile duct:

Diameter: The common bile duct is normal measuring 2.4 mm in
diameter.

Liver:

The parenchyma of the liver appears normal. No focal hepatic
abnormality is seen. Portal vein is patent on color Doppler imaging
with normal direction of blood flow towards the liver.
IMPRESSION: 1. No gallstones.
2. No abnormality of the liver is seen.

## 2020-07-28 ENCOUNTER — Ambulatory Visit: Payer: Self-pay

## 2020-09-24 ENCOUNTER — Other Ambulatory Visit: Payer: Self-pay

## 2020-09-24 ENCOUNTER — Ambulatory Visit
Admission: EM | Admit: 2020-09-24 | Discharge: 2020-09-24 | Disposition: A | Discharge status: Home / Self Care | Payer: Medicaid Other | Attending: Urgent Care | Admitting: Urgent Care

## 2020-09-24 DIAGNOSIS — B354 Tinea corporis: Secondary | ICD-10-CM | POA: Diagnosis not present

## 2020-09-24 MED ORDER — FLUCONAZOLE 150 MG PO TABS
150.0000 mg | ORAL_TABLET | ORAL | 0 refills | Status: AC
Start: 2020-09-24 — End: ?

## 2020-09-24 MED ORDER — HYDROXYZINE HCL 25 MG PO TABS: 12.5000 mg | ORAL_TABLET | Freq: Three times a day (TID) | ORAL | 0 refills | Status: AC | PRN

## 2020-09-24 NOTE — ED Triage Notes (Addendum)
Pt c/o an itchy, round raised rash to abdomen and back since Wednesday. States saw her PCP on Wednesday tx'd with benadryl. States rash is worse. Denies any changes or new products.

## 2020-09-24 NOTE — ED Provider Notes (Signed)
[facility]   MRN: [id number] DOB: 2001-04-22  Subjective:   Patricia Davenport is a 20 y.o. female presenting for 3-day history of acute onset persistent worsening itchy and stinging rash that started on her back and has now spread to her torso.  Denies any drainage, erythema, fevers.  Has been using Benadryl without much relief.  No current facility-administered medications for this encounter.  Current Outpatient Medications:  .  etonogestrel (NEXPLANON) 68 MG IMPL implant, 1 each by Subdermal route once., Disp: , Rfl:  .  pantoprazole (PROTONIX) 20 MG tablet, Take 1 tablet (20 mg total) by mouth daily., Disp: 30 tablet, Rfl: 0 .  rizatriptan (MAXALT-MLT) 5 MG disintegrating tablet, PLEASE SEE ATTACHED FOR DETAILED DIRECTIONS, Disp: , Rfl:  .  sertraline (ZOLOFT) 100 MG tablet, Take 100 mg by mouth daily., Disp: , Rfl:  .  VALACYCLOVIR HCL PO, Take by mouth., Disp: , Rfl:    No Known Allergies  Past Medical History:  Diagnosis Date  . Anxiety   . Asthma    childhood  . Eczema   . History of anxiety      Past Surgical History:  Procedure Laterality Date  . ABCESS DRAINAGE    . ADENOIDECTOMY    . TONSILLECTOMY    . WISDOM TOOTH EXTRACTION      Family History  Problem Relation Age of Onset  . Thyroid disease Mother   . GER disease Mother   . Hypertension Father   . GER disease Father   . Hypertension Maternal Grandmother   . Diabetes Maternal Grandmother   . Hypertension Paternal Grandmother   . Hypertension Paternal Grandfather   . Irritable bowel syndrome Neg Hx   . Colitis Neg Hx     Social History   Tobacco Use  . Smoking status: Never Smoker  . Smokeless tobacco: Never Used  Vaping Use  . Vaping Use: Never used  Substance Use Topics  . Alcohol use: No  . Drug use: No    ROS   Objective:   Vitals: BP 121/81 (BP Location: Left Arm)   Pulse 91   Temp 98 F (36.7 C) (Oral)   Resp 18   SpO2 98%   Physical Exam Constitutional:       General: She is not in acute distress.    Appearance: Normal appearance. She is well-developed. She is not ill-appearing.  HENT:     Head: Normocephalic and atraumatic.     Nose: Nose normal.     Mouth/Throat:     Mouth: Mucous membranes are moist.     Pharynx: Oropharynx is clear.  Eyes:     General: No scleral icterus.    Extraocular Movements: Extraocular movements intact.     Pupils: Pupils are equal, round, and reactive to light.  Cardiovascular:     Rate and Rhythm: Normal rate.  Pulmonary:     Effort: Pulmonary effort is normal.  Skin:    General: Skin is warm and dry.     Findings: Rash (scattered lightly pigmented annular lesions with hyperpigmented scaling centrally; lesions have varying sizes between 1 to 2 cm) present.  Neurological:     General: No focal deficit present.     Mental Status: She is alert and oriented to person, place, and time.  Psychiatric:        Mood and Affect: Mood normal.        Behavior: Behavior normal.     Assessment and Plan :   PDMP not reviewed  this encounter.  1. Tinea corporis     Will cover for tinea corporis with Diflucan, hydroxyzine for itching.  Follow-up with PCP. Counseled patient on potential for adverse effects with medications prescribed/recommended today, ER and return-to-clinic precautions discussed, patient verbalized understanding.    Wallis Bamberg, [place] 09/24/20 2336

## 2020-11-01 ENCOUNTER — Encounter: Payer: Self-pay | Admitting: *Deleted

## 2020-11-01 ENCOUNTER — Telehealth: Payer: Self-pay | Admitting: *Deleted

## 2020-11-01 NOTE — Telephone Encounter (Signed)
Patient was on Jessica Zehr's schedule for abdominal pain. It was noted patient has history with pediatric GI from 2 years ago. However, patient's referral records [facility] indicate that patient had colonoscopy with GI physician at [facility] on 07/07/20. Unfortunately, we will need full records from patient from GI care this past year in order to have a physician to look over and decide whether we can accept her before seeing her in the office.

## 2020-11-02 ENCOUNTER — Ambulatory Visit: Payer: Medicaid Other | Admitting: Gastroenterology

## 2020-11-15 NOTE — Telephone Encounter (Signed)
Called patient to follow up on records needed and provided her with our fax number records pending.

## 2020-11-19 ENCOUNTER — Other Ambulatory Visit: Payer: Self-pay

## 2020-11-19 ENCOUNTER — Emergency Department (HOSPITAL_COMMUNITY)
Admission: EM | Admit: 2020-11-19 | Discharge: 2020-11-19 | Disposition: A | Discharge status: Home / Self Care | Payer: Medicaid Other | Attending: Emergency Medicine | Admitting: Emergency Medicine

## 2020-11-19 ENCOUNTER — Encounter (HOSPITAL_COMMUNITY): Payer: Self-pay

## 2020-11-19 DIAGNOSIS — R110 Nausea: Secondary | ICD-10-CM | POA: Diagnosis not present

## 2020-11-19 DIAGNOSIS — K219 Gastro-esophageal reflux disease without esophagitis: Secondary | ICD-10-CM | POA: Diagnosis not present

## 2020-11-19 DIAGNOSIS — J45909 Unspecified asthma, uncomplicated: Secondary | ICD-10-CM | POA: Diagnosis not present

## 2020-11-19 DIAGNOSIS — R1084 Generalized abdominal pain: Secondary | ICD-10-CM | POA: Insufficient documentation

## 2020-11-19 DIAGNOSIS — R519 Headache, unspecified: Secondary | ICD-10-CM | POA: Diagnosis present

## 2020-11-19 HISTORY — DX: Irritable bowel syndrome, unspecified: K58.9

## 2020-11-19 LAB — URINALYSIS, ROUTINE W REFLEX MICROSCOPIC
Bilirubin Urine: NEGATIVE
Glucose, UA: NEGATIVE mg/dL
Hgb urine dipstick: NEGATIVE
Ketones, ur: 5 mg/dL — AB
Leukocytes,Ua: NEGATIVE
Nitrite: NEGATIVE
Protein, ur: 30 mg/dL — AB
Specific Gravity, Urine: 1.03 (ref 1.005–1.030)
pH: 6 (ref 5.0–8.0)

## 2020-11-19 LAB — CBC WITH DIFFERENTIAL/PLATELET
Abs Immature Granulocytes: 0.01 10*3/uL (ref 0.00–0.07)
Basophils Absolute: 0 10*3/uL (ref 0.0–0.1)
Basophils Relative: 1 %
Eosinophils Absolute: 0.2 10*3/uL (ref 0.0–0.5)
Eosinophils Relative: 3 %
HCT: 37.4 % (ref 36.0–46.0)
Hemoglobin: 12.4 g/dL (ref 12.0–15.0)
Immature Granulocytes: 0 %
Lymphocytes Relative: 57 %
Lymphs Abs: 3.4 10*3/uL (ref 0.7–4.0)
MCH: 29.9 pg (ref 26.0–34.0)
MCHC: 33.2 g/dL (ref 30.0–36.0)
MCV: 90.1 fL (ref 80.0–100.0)
Monocytes Absolute: 0.4 10*3/uL (ref 0.1–1.0)
Monocytes Relative: 7 %
Neutro Abs: 1.9 10*3/uL (ref 1.7–7.7)
Neutrophils Relative %: 32 %
Platelets: 317 10*3/uL (ref 150–400)
RBC: 4.15 MIL/uL (ref 3.87–5.11)
RDW: 12.6 % (ref 11.5–15.5)
WBC: 5.9 10*3/uL (ref 4.0–10.5)
nRBC: 0 % (ref 0.0–0.2)

## 2020-11-19 LAB — COMPREHENSIVE METABOLIC PANEL
ALT: 16 U/L (ref 0–44)
AST: 16 U/L (ref 15–41)
Albumin: 3.8 g/dL (ref 3.5–5.0)
Alkaline Phosphatase: 51 U/L (ref 38–126)
Anion gap: 6 (ref 5–15)
BUN: 9 mg/dL (ref 6–20)
CO2: 26 mmol/L (ref 22–32)
Calcium: 9.2 mg/dL (ref 8.9–10.3)
Chloride: 106 mmol/L (ref 98–111)
Creatinine, Ser: 0.85 mg/dL (ref 0.44–1.00)
GFR, Estimated: >60 mL/min (ref >60)
Glucose, Bld: 101 mg/dL — ABNORMAL HIGH (ref 70–99)
Potassium: 3.5 mmol/L (ref 3.5–5.1)
Sodium: 138 mmol/L (ref 135–145)
Total Bilirubin: 0.3 mg/dL (ref 0.3–1.2)
Total Protein: 6.5 g/dL (ref 6.5–8.1)

## 2020-11-19 LAB — I-STAT BETA HCG BLOOD, ED (MC, WL, AP ONLY): I-stat hCG, quantitative: <5 m[IU]/mL (ref <5)

## 2020-11-19 LAB — LIPASE, BLOOD: Lipase: 27 U/L (ref 11–51)

## 2020-11-19 MED ORDER — METOCLOPRAMIDE HCL 5 MG/ML IJ SOLN
10.0000 mg | Freq: Once | INTRAMUSCULAR | Status: DC
  Filled 2020-11-19: qty 2

## 2020-11-19 MED ORDER — DIPHENHYDRAMINE HCL 50 MG/ML IJ SOLN
50.0000 mg | Freq: Once | INTRAMUSCULAR | Status: DC
  Filled 2020-11-19: qty 1

## 2020-11-19 MED ORDER — SODIUM CHLORIDE 0.9 % IV BOLUS: 1000.0000 mL | Freq: Once | INTRAVENOUS | Status: DC

## 2020-11-19 NOTE — ED Triage Notes (Signed)
Emergency Medicine Provider Triage Evaluation Note  Patricia Davenport , a 20 y.o. female  was evaluated in triage.  Pt complains of nv, lightheadedness, abd pain, headache 3-4 years ago when she got a nexplanon  Review of Systems  Positive: Nv, lightheadedness, abd pain, headaches Negative: Persistent Vision changes  Physical Exam  BP 118/72 (BP Location: Right Arm)   Pulse 86   Temp 98.5 F (36.9 C) (Oral)   Resp 17   SpO2 100%  Gen:   Awake, no distress   HEENT:  Atraumatic  Resp:  Normal effort  Cardiac:  Normal rate  Abd:   Nondistended, nontender  MSK:   Moves extremities without difficulty  Neuro:  Speech clear   Medical Decision Making  Medically screening exam initiated at 3:44 PM.  Appropriate orders placed.  Patricia Davenport was informed that the remainder of the evaluation will be completed by another provider, this initial triage assessment does not replace that evaluation, and the importance of remaining in the ED until their evaluation is complete.  Clinical Impression   MSE was initiated and I personally evaluated the patient and placed orders (if any) at  3:47 PM on November 19, 2020.  The patient appears stable so that the remainder of the MSE may be completed by another provider.    Karrie Meres, [place] 11/19/20 1549

## 2020-11-19 NOTE — Discharge Instructions (Addendum)
You came to the emergency department today to be evaluated for your headache and nausea.  Your physical exam and lab work were reassuring.  Please follow-up with your gastroenterologist if your nausea does not improve.  Please follow-up with your neurologist about your headaches.  Please take Ibuprofen (Advil, motrin) and Tylenol (acetaminophen) to relieve your pain.    You may take up to 600 MG (3 pills) of normal strength ibuprofen every 8 hours as needed.   You make take tylenol, up to 1,000 mg (two extra strength pills) every 8 hours as needed.   It is safe to take ibuprofen and tylenol at the same time as they work differently.   Do not take more than 3,000 mg tylenol in a 24 hour period (not more than one dose every 8 hours.  Please check all medication labels as many medications such as pain and cold medications may contain tylenol.  Do not drink alcohol while taking these medications.  Do not take other NSAID'S while taking ibuprofen (such as aleve or naproxen).  Please take ibuprofen with food to decrease stomach upset.  Get help right away if: Your headache becomes severe quickly. Your headache gets worse after moderate to intense physical activity. You have repeated vomiting. You have a stiff neck. You have a loss of vision. You have problems with speech. You have pain in the eye or ear. You have muscular weakness or loss of muscle control. You lose your balance or have trouble walking. You feel faint or pass out. You have confusion. You have a seizure.

## 2020-11-19 NOTE — ED Provider Notes (Signed)
[facility] EMERGENCY DEPARTMENT Provider Note   CSN: [id number] Arrival date & time: 11/19/20  1504     History Chief Complaint  Patient presents with  . Emesis    Patricia Davenport is a 20 y.o. female with a history of IBS, anal fissures, anxiety, GERD.  Patient presents with a chief complaint of nausea, and headache.  Patient reports that she has a history of migraine headaches.  Patient reports she has been having migraine headaches since her first Nexplanon was placed approximately 4 years prior.  Patient reports that over the last 2 weeks she has been having headaches intermittently.  Patient reports that she had headache that started 1100 this morning.  Headache onset was gradual, pain progressively worse over time.  Patient has no change in pain with exertion.  Patient does endorse photophobia, fatigue and "feeling like I am swaying."  Patient denies any facial asymmetry, numbness, weakness, general disturbance, syncopal episodes, seizures.  Patient reports that she has not been taking any medications to help with her headache due to her nausea.  Patient reports that she has appointment to see neurologist on June 1.  Patient reports that she has been having nausea over the last week.  Patient reports that her nausea has caused a decrease in her oral intake.  Patient denies any vomiting in the last 24 hours.  Patient's nausea is worse with oral intake.  Patient endorses generalized abdominal pain and cramping.  Patient states that she has a history of IBS and has been having 6 bowel movements daily.  Reports that her abdominal pain and frequent bowel movements are baseline for her.  Patient reports that she sees [facility].    Patient denies any fever, chills, hemoptysis, coffee-ground emesis, urinary symptoms.  LMP 3 weeks prior.  Patient reports that she has Nexplanon.  Nexplanon was placed October 2021.  HPI     Past Medical History:  Diagnosis Date  . Anal  fissure   . Anxiety   . Asthma    childhood  . Eczema   . GERD (gastroesophageal reflux disease)   . History of anxiety   . IBS (irritable bowel syndrome)     Patient Active Problem List   Diagnosis Date Noted  . Nausea with vomiting 06/03/2018  . Obesity 06/03/2018    Past Surgical History:  Procedure Laterality Date  . ABCESS DRAINAGE    . ADENOIDECTOMY    . TONSILLECTOMY    . WISDOM TOOTH EXTRACTION       OB History    Gravida  0   Para  0   Term  0   Preterm  0   AB  0   Living  0     SAB  0   IAB  0   Ectopic  0   Multiple  0   Live Births  0           Family History  Problem Relation Age of Onset  . Thyroid disease Mother   . GER disease Mother   . Hypertension Father   . GER disease Father   . Hypertension Maternal Grandmother   . Diabetes Maternal Grandmother   . Hypertension Paternal Grandmother   . Hypertension Paternal Grandfather   . Irritable bowel syndrome Neg Hx   . Colitis Neg Hx     Social History   Tobacco Use  . Smoking status: Never Smoker  . Smokeless tobacco: Never Used  Vaping Use  . Vaping  Use: Never used  Substance Use Topics  . Alcohol use: No  . Drug use: No    Home Medications Prior to Admission medications   Medication Sig Start Date End Date Taking? Authorizing Provider  etonogestrel (NEXPLANON) 68 MG IMPL implant 1 each by Subdermal route once.    [provider]  fluconazole (DIFLUCAN) 150 MG tablet Take 1 tablet (150 mg total) by mouth once a week. 09/24/20   Wallis Bamberg, PA-C  hydrOXYzine (ATARAX/VISTARIL) 25 MG tablet Take 0.5-1 tablets (12.5-25 mg total) by mouth every 8 (eight) hours as needed for itching. 09/24/20   Wallis Bamberg, PA-C  pantoprazole (PROTONIX) 20 MG tablet Take 1 tablet (20 mg total) by mouth daily. 02/26/20   Hall-Potvin, [place], PA-C  rizatriptan (MAXALT-MLT) 5 MG disintegrating tablet PLEASE SEE ATTACHED FOR DETAILED DIRECTIONS 08/05/18   [provider]   sertraline (ZOLOFT) 100 MG tablet Take 100 mg by mouth daily.    [provider]  VALACYCLOVIR HCL PO Take by mouth.    [provider]  escitalopram (LEXAPRO) 10 MG tablet TAKE 1 TABLET BY MOUTH DAILY FOR ANXIETY 08/19/18 02/26/20  [provider]  norgestimate-ethinyl estradiol (ORTHO-CYCLEN) 0.25-35 MG-MCG tablet Take 1 tablet by mouth daily. 01/13/19 02/26/20  Constant, Peggy, MD  propranolol ER (INDERAL LA) 60 MG 24 hr capsule TAKE 1 CAPSULE BY MOUTH EVERY MORNING FOR PREVENTION OF MIGRAINE SYMPTOMS 06/26/18 02/26/20  [provider]    Allergies    Patient has no known allergies.  Review of Systems   Review of Systems  Constitutional: Positive for fatigue. Negative for chills and fever.  Eyes: Positive for photophobia. Negative for visual disturbance.  Respiratory: Negative for cough and shortness of breath.   Cardiovascular: Negative for chest pain and leg swelling.  Gastrointestinal: Positive for abdominal pain, blood in stool (occasional d/t anal fissures), nausea and vomiting. Negative for abdominal distention, anal bleeding, constipation, diarrhea and rectal pain.  Genitourinary: Negative for difficulty urinating, dysuria, frequency, genital sores, hematuria, urgency, vaginal bleeding, vaginal discharge and vaginal pain.  Musculoskeletal: Negative for back pain, neck pain and neck stiffness.  Skin: Negative for color change and rash.  Neurological: Positive for headaches. Negative for dizziness, tremors, seizures, syncope, facial asymmetry, speech difficulty, weakness, light-headedness and numbness.  Psychiatric/Behavioral: Negative for confusion.    Physical Exam Updated Vital Signs BP 118/72 (BP Location: Right Arm)   Pulse 86   Temp 98.5 F (36.9 C) (Oral)   Resp 17   SpO2 100%   Physical Exam Vitals and nursing note reviewed.  Constitutional:      General: She is not in acute distress.    Appearance: She is not ill-appearing,  toxic-appearing or diaphoretic.  HENT:     Head: Normocephalic.  Eyes:     General: No scleral icterus.       Right eye: No discharge.        Left eye: No discharge.     Extraocular Movements: Extraocular movements intact.     Pupils: Pupils are equal, round, and reactive to light.  Cardiovascular:     Rate and Rhythm: Normal rate.     Heart sounds: Normal heart sounds.  Pulmonary:     Effort: Pulmonary effort is normal.     Breath sounds: Normal breath sounds.  Abdominal:     General: Bowel sounds are normal. There is no distension. There are no signs of injury.     Palpations: Abdomen is soft. There is no mass or pulsatile mass.  Tenderness: There is no abdominal tenderness. There is no guarding or rebound.     Hernia: There is no hernia in the umbilical area or ventral area.  Musculoskeletal:     Cervical back: Normal range of motion and neck supple. No rigidity.  Skin:    General: Skin is warm and dry.  Neurological:     General: No focal deficit present.     Mental Status: She is alert and oriented to person, place, and time.     GCS: GCS eye subscore is 4. GCS verbal subscore is 5. GCS motor subscore is 6.     Cranial Nerves: No cranial nerve deficit or facial asymmetry.     Sensory: Sensation is intact.     Motor: No weakness, tremor, seizure activity or pronator drift.     Coordination: Romberg sign negative. Finger-Nose-Finger Test normal.     Gait: Gait is intact. Gait normal.     Comments: CN II-XII intact, equal grip strength, +5 strength to bilateral upper and lower extremities   Patient able to stand and ambulate without any difficulty  Psychiatric:        Behavior: Behavior is cooperative.     ED Results / Procedures / Treatments   Labs (all labs ordered are listed, but only abnormal results are displayed) Labs Reviewed  COMPREHENSIVE METABOLIC PANEL - Abnormal; Notable for the following components:      Result Value   Glucose, Bld 101 (*)    All  other components within normal limits  URINALYSIS, ROUTINE W REFLEX MICROSCOPIC - Abnormal; Notable for the following components:   Ketones, ur 5 (*)    Protein, ur 30 (*)    Bacteria, UA RARE (*)    All other components within normal limits  CBC WITH DIFFERENTIAL/PLATELET  LIPASE, BLOOD  I-STAT BETA HCG BLOOD, ED ([facility], [facility], [facility] ONLY)    EKG None  Radiology No results found.  Procedures Procedures   Medications Ordered in ED Medications - No data to display  ED Course  I have reviewed the triage vital signs and the nursing notes.  Pertinent labs & imaging results that were available during my care of the patient were reviewed by me and considered in my medical decision making (see chart for details).    MDM Rules/Calculators/A&P                          Alert 20 year old female no acute distress, nontoxic-appearing.  Patient presents with chief complaint of nausea and headache.   Headache onset was gradual, pain progressively worse over time.  Patient has no change in pain with exertion.  Patient does endorse photophobia, fatigue and "feeling like I am swaying."  Patient denies any facial asymmetry, numbness, weakness, general disturbance, syncopal episodes, seizures.  Patient reports that she has a history of migraines.  Patient reports she has not been taking any medication to help with her migraine due to her nausea. On physical exam patient has no focal neurological deficits. Low suspicion for subarachnoid hemorrhage or intracranial abnormality at this time.   Patient reports nausea over the last week.  Patient reports nausea is worse with any p.o. intake.  Patient has not vomited in the last 24 hours.  Patient also endorses generalized abdominal cramping and frequent bowel movements however these symptoms are baseline for her due to her IBS.  Lab work was obtained while patient was in triage.  Pregnancy test negative; low suspicion for intrauterine  or ectopic  pregnancy. Urinalysis shows rare bacteria, leukocytes negative, nitrate negative; patient denies any urinary symptoms.  Low suspicion for UTI at this time.  Ketones noted in patient's urine likely secondary to decreased oral intake. CBC is unremarkable. Lipase within normal limits; low suspicion for acute pancreatitis. CMP is unremarkable. EKG shows normal sinus rhythm.  Will give patient migraine cocktail and 1 L fluid bolus.  1848 patient is requesting to be discharged from the hospital due to waiting for medication administration.  Told patient I would speak to her RN and have meds not immediately to her however patient is adamant that she wants to leave the hospital at this time.  Vital signs hemodynamically stable.  Patient has had no episodes of emesis since being moved to hallway bed.  Will discharge home and have patient follow-up with her neurologist and gastroenterologist.  Final Clinical Impression(s) / ED Diagnoses Final diagnoses:  Nausea  Acute nonintractable headache, unspecified headache type    Rx / DC Orders ED Discharge Orders    None       Haskel SchroederBadalamente, [place] R, PA-C 11/20/20 Quentin [id number]    Goldston, Scott, MD 11/21/20 1535

## 2020-11-19 NOTE — ED Triage Notes (Signed)
Pt states she has had several months of nausea, vomiting, blood in stool, passing out, and dizziness. Pt states she has been seen by GI and had a colonoscopy, pt was seen for same and has an appointment with neurology on 5/1 but was told to come here after having another episode of nausea, vomiting and feeling weak. Pt c/o headache and abdomen.

## 2020-12-02 ENCOUNTER — Ambulatory Visit: Payer: Medicaid Other | Admitting: Neurology

## 2020-12-02 ENCOUNTER — Encounter: Payer: Self-pay | Admitting: Neurology

## 2020-12-02 VITALS — BP 122/77 | HR 62 | Ht 61.0 in | Wt 196.3 lb

## 2020-12-02 DIAGNOSIS — R42 Dizziness and giddiness: Secondary | ICD-10-CM | POA: Diagnosis not present

## 2020-12-02 DIAGNOSIS — H9312 Tinnitus, left ear: Secondary | ICD-10-CM | POA: Diagnosis not present

## 2020-12-02 DIAGNOSIS — G43809 Other migraine, not intractable, without status migrainosus: Secondary | ICD-10-CM | POA: Diagnosis not present

## 2020-12-02 DIAGNOSIS — R519 Headache, unspecified: Secondary | ICD-10-CM

## 2020-12-02 DIAGNOSIS — H9193 Unspecified hearing loss, bilateral: Secondary | ICD-10-CM

## 2020-12-02 NOTE — Patient Instructions (Addendum)
It was good to see you again today.  Given your recurrent headaches and dizziness, it is possible that you have several issues going on concomitantly.  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  Nevertheless, given your hearing loss and ringing in the ears I would recommend evaluation through ENT.  Please talk to Larita Fife about a referral, in addition, since you have had lightheadedness and palpitations and chest tightness, I would recommend evaluation through cardiology.  Sometimes autonomic dysregulation can cause the symptoms.  Please talk to Larita Fife about a full panel blood work to check for vitamin deficiencies and thyroid dysfunction as well as electrolyte disturbances.  Please try to hydrate better with water, 6 to 8 cups of water per day are recommended, generally speaking, 8 ounce size each.  Try to rest well, 7 to 8 hours of sleep are recommended.  For migraine prevention, we will consider a preventative such as gabapentin.  I would like to avoid amitriptyline even though we discussed that, since you are taking additional medications that can affect the serotonin level, you are taking Lexapro and Maxalt for migraines.  You can continue with Bernita Raisin as needed per your primary care.  You can also continue with your nausea medicine, ondansetron.    For now, we will proceed with a brain MRI with and without contrast to rule out a structural cause or change in your brain scan.  I would recommend that you pursue these referrals as discussed and follow-up with primary care for blood work.  We can consider a new medication after you have had the brain scan.  Please verify with your primary care if you are supposed to take sertraline and Lexapro together.  Please also consult with your GYN regarding possible side effects from Nexplanon.

## 2020-12-02 NOTE — Progress Notes (Signed)
Subjective:    Patient ID: Patricia Davenport is a 20 y.o. female.  HPI     Patricia FoleySaima Deryck Hippler, MD, PhD [facility] [address], [address] [address] [place], [place] [id number]  Dear Larita FifeLynn,    I saw your patient, Patricia Davenport Nurse, upon your kind request in my neurologic clinic today for initial consultation of her dizziness, concern for vertigo. The patient is accompanied by her mother and her brother today. As you know, Patricia Davenport is a 20 year old female with a history of migraine headaches, constipation, anxiety, and obesity, who reports approximately 1 month history of recurrent dizziness.  Sometimes the dizziness feels like a swaying or spinning sensation, sometimes it is positional with lightheadedness and palpitations.  Sometimes she feels a chest tightness and feels like she is going to faint.  She feels sweaty and weak at times.  She has had associated headaches.  She is no longer on propranolol or Topamax.  She has been on Vanuatubrelvy as needed and Maxalt as needed as well as ondansetron as needed.  She has seen GI in the past for nausea and vomiting and had endoscopies.  She has not had any recent falls.  Reports that she went to physical therapy but they felt she did not have vertigo.  She has not had any recent blood work.  Mom is wondering if she has a thyroid dysfunction since mom had hyperthyroidism.  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  Patient reports hearing loss in both ears around the same time.  She also reports intermittent ringing in the left more than right ear.  She has not seen ENT or cardiology.  She does not sleep well.  She is currently not working.  She is on Lexapro.  Of note, she also indicates being on sertraline but your records do not indicate sertraline.  She is wondering if she is having a side effect on Nexplanon.  She has not talked to her GYN about this yet.  She does not drink  caffeine daily.  She drinks approximately 2 bottles of water per day.  She does not drink any alcohol.  Her headaches are associated with light sensitivity.  She has prescription eyeglasses.  She had her last eye exam in December 2021.  I reviewed your office note from 10/26/2020.  He was treated symptomatically with meclizine and a referral to physical therapy was discussed.  She was also given ondansetron for nausea.  She was referred to physical therapy on 11/10/2020. The physical therapist felt that her symptoms were not in keeping with positional vertigo and was concerned about a central cause of her vertigo.  I had previously evaluated her over 2 years ago for intractable nausea and vomiting, concern for migraines.  We proceeded with a sleep study and a brain scan.  She had a brain MRI with and without contrast on 08/11/2018 and I reviewed the results:  IMPRESSION: This MRI of the brain with and without contrast shows the following: 1.   The brain appears normal before and after contrast. 2.   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   We had called her with her test results.  She had a baseline sleep study on 08/30/2018 which showed no significant obstructive sleep apnea.  Overall AHI was 0/h.  Average oxygen saturation was 98%, nadir was 94%.  She had no significant PLM's.  She had  very little snoring.  Sleep latency was 25 minutes, REM latency mildly delayed at 144.5 minutes.  Sleep efficiency was 90.8%.  She had a mildly increased percentage of stage II sleep, a mildly increased percentage of slow-wave sleep and Reduced percentage of REM sleep.   Previously:   07/31/18: 20 year old right-handed woman with an underlying medical history of intractable nausea and vomiting, obesity, and childhood asthma, who reports recent onset of intractable nausea and vomiting, starting in or around July 2019. I reviewed your office note from 07/19/2018, which you  kindly included. For migraine headaches she has been tried on rizatriptan and propranolol. She had side effects on the propranolol including dizziness and lightheadedness, a syncopal episode was reported at school as well. She has had some intermittent palpitations. She has had significant nausea and vomiting and had workup through GI for this and follow-up is scheduled, she may need an upper GI endoscopy. She has had an abdominal ultrasound and gastric emptying test recently with unremarkable findings. She was recently switched to topiramate for migraine prevention. She denies any history of recurrent headaches, she has had some recent photophobia. She has not had any similar symptoms growing up, no history of abdominal migraines as a child. She had childhood asthma but outgrew them. She has a history of RSV as an infant. She has a family history of migraines affecting her father and sister and she has a family history of stroke and sleep apnea in her maternal grandmother. She has gained weight. She does feel tired during the day and has a history of snoring. She developed some headaches as a result of trying migraine medications she states. She could not tolerate the Topamax or propranolol. She is currently not on any of these. She has a GI follow-up pending and may have a upper GI endoscopy from what I understand. She has no history of febrile seizures or epilepsy. She has been trying to get enough sleep, she tries to hydrate with water estimates that she drinks about 3 bottles of water, soda about 2-3 per day. Bedtime is around 10 but sometimes as late as midnight when she has swim practice. Rise time between 6 and 7. She had a tonsillectomy and adenoidectomy in elementary school.   Her Past Medical History Is Significant For: Past Medical History:  Diagnosis Date  . Anal fissure   . Anxiety   . Asthma    childhood  . Eczema   . GERD (gastroesophageal reflux disease)   . History of anxiety   . IBS  (irritable bowel syndrome)     Her Past Surgical History Is Significant For: Past Surgical History:  Procedure Laterality Date  . ABCESS DRAINAGE    . ADENOIDECTOMY    . TONSILLECTOMY    . WISDOM TOOTH EXTRACTION      Her Family History Is Significant For: Family History  Problem Relation Age of Onset  . Thyroid disease Mother   . GER disease Mother   . Hypertension Father   . GER disease Father   . Hypertension Maternal Grandmother   . Diabetes Maternal Grandmother   . Hypertension Paternal Grandmother   . Hypertension Paternal Grandfather   . Irritable bowel syndrome Neg Hx   . Colitis Neg Hx     Her Social History Is Significant For: Social History   Socioeconomic History  . Marital status: Single    Spouse name: Not on file  . Number of children: Not on file  . Years of education: Not on  file  . Highest education level: Not on file  Occupational History  . Not on file  Tobacco Use  . Smoking status: Never Smoker  . Smokeless tobacco: Never Used  Vaping Use  . Vaping Use: Never used  Substance and Sexual Activity  . Alcohol use: No  . Drug use: No  . Sexual activity: Not Currently    Birth control/protection: Implant  Other Topics Concern  . Not on file  Social History Narrative   12th grade- [employer]. Lives with mom   Social Determinants of Health   Financial Resource Strain: Not on file  Food Insecurity: Not on file  Transportation Needs: Not on file  Physical Activity: Not on file  Stress: Not on file  Social Connections: Not on file    Her Allergies Are:  No Known Allergies:   Her Current Medications Are:  Outpatient Encounter Medications as of 12/02/2020  Medication Sig  . escitalopram (LEXAPRO) 20 MG tablet Take 20 mg by mouth daily.  Marland Kitchen etonogestrel (NEXPLANON) 68 MG IMPL implant 1 each by Subdermal route once.  . fluconazole (DIFLUCAN) 150 MG tablet Take 1 tablet (150 mg total) by mouth once a week.  . hydrOXYzine  (ATARAX/VISTARIL) 25 MG tablet Take 0.5-1 tablets (12.5-25 mg total) by mouth every 8 (eight) hours as needed for itching.  . pantoprazole (PROTONIX) 20 MG tablet Take 1 tablet (20 mg total) by mouth daily.  . rizatriptan (MAXALT-MLT) 5 MG disintegrating tablet PLEASE SEE ATTACHED FOR DETAILED DIRECTIONS  . sertraline (ZOLOFT) 100 MG tablet Take 100 mg by mouth daily.  Marland Kitchen VALACYCLOVIR HCL PO Take by mouth.  . [DISCONTINUED] escitalopram (LEXAPRO) 10 MG tablet TAKE 1 TABLET BY MOUTH DAILY FOR ANXIETY  . [DISCONTINUED] norgestimate-ethinyl estradiol (ORTHO-CYCLEN) 0.25-35 MG-MCG tablet Take 1 tablet by mouth daily.  . [DISCONTINUED] propranolol ER (INDERAL LA) 60 MG 24 hr capsule TAKE 1 CAPSULE BY MOUTH EVERY MORNING FOR PREVENTION OF MIGRAINE SYMPTOMS   No facility-administered encounter medications on file as of 12/02/2020.  :   Review of Systems:  Out of a complete 14 point review of systems, all are reviewed and negative with the exception of these symptoms as listed below:  Review of Systems  Neurological:       Here for consult on dizziness. Pt reports sx started 2 months ago and have persisted since. Reports migraines h/a are still uncontrolled. Reports daily h/a.    Objective:  Neurological Exam  Physical Exam Physical Examination:   Vitals:   12/02/20 0959  BP: 122/77  Pulse: 62   On orthostatic testing, standing blood pressure 117/75 with a pulse of 76. She does not have any telltale vertiginous symptoms currently and no significant orthostatic lightheadedness.  General Examination: The patient is a very pleasant 20 y.o. female in no acute distress. She appears well-developed and well-nourished and well groomed.   HEENT: Normocephalic, atraumatic, pupils are equal, round and reactive to light and accommodation. Funduscopic exam is normal with sharp disc margins noted. Corrective eyeglasses in place. She denies any double vision or blurry vision currently. May be time for  her eye examination, per mom. Extraocular tracking is good without limitation to gaze excursion or nystagmus noted. Normal smooth pursuit is noted. Hearing is grossly intact. Face is symmetric with normal facial animation and normal facial sensation. Speech is clear with no dysarthria noted. There is no hypophonia. There is no lip, neck/head, jaw or voice tremor. Neck is supple with full range of passive and active motion.  There are no carotid bruits on auscultation. Oropharynx exam reveals: mild mouth dryness, adequate dental hygiene and mild airway crowding, due to smaller airway entry and wider uvula, tonsils are absent. Tongue protrudes centrally and palate elevates symmetrically, Mallampati is class II. Neck circumference is 15-1/2 inches.  Chest: Clear to auscultation without wheezing, rhonchi or crackles noted.  Heart: S1+S2+0, regular and normal without murmurs, rubs or gallops noted.   Abdomen: Soft, non-tender and non-distended with normal bowel sounds appreciated on auscultation.  Extremities: There is no pitting edema in the distal lower extremities bilaterally. Pedal pulses are intact.  Skin: Warm and dry without trophic changes noted. There are no varicose veins.  Musculoskeletal: exam reveals no obvious joint deformities, tenderness or joint swelling or erythema.   Neurologically:  Mental status: The patient is awake, alert and oriented in all 4 spheres. Her immediate and remote memory, attention, language skills and fund of knowledge are appropriate. There is no evidence of aphasia, agnosia, apraxia or anomia. Speech is clear with normal prosody and enunciation. Thought process is linear. Mood is normal and affect is normal.  Cranial nerves II - XII are as described above under HEENT exam. In addition: shoulder shrug is normal with equal shoulder height noted. Motor exam: Normal bulk, strength and tone is noted. There is no drift, tremor or rebound. Romberg is negative.  Reflexes are 2+ throughout. Fine motor skills and coordination: intact with normal finger taps, normal hand movements, normal rapid alternating patting, normal foot taps and normal foot agility.  Cerebellar testing: No dysmetria or intention tremor on finger to nose testing. Heel to shin is unremarkable bilaterally. There is no truncal or gait ataxia.  Sensory exam: intact to light touch in the upper and lower extremities.  Gait, station and balance: She stands easily. No veering to one side is noted. No leaning to one side is noted. Posture is age-appropriate and stance is narrow based. Gait shows normal stride length and normal pace. No problems turning are noted. Tandem walk is unremarkable.   Assessment and Plan:    In summary, Patricia Davenport is a very pleasant 20 year old female with a history of migraine headaches, constipation, anxiety, and obesity, who presents for evaluation of her dizziness of approximately 1 months duration.  She has had different symptoms including orthostatic lightheadedness, spinning and swaying sensation, sometimes chest tightness, also hearing loss and ringing in the ear on the left mostly.  Symptom constellation is unlikely secondary to 1 underlying cause, differential diagnosis includes vasovagal symptoms, orthostatic hypotension, autonomic dysregulation, positional vertigo, vestibular migraines, dehydration.  She is advised to seek further evaluation through your office with blood work since mom also voiced concern about thyroid dysfunction.  She had milder changes on orthostatic testing but no significant systolic or diastolic blood pressure drop, mild increase in heart rate.  She is advised to talk to you about additional blood work with regards to electrolyte testing, chemistry panel in general, thyroid function test, anemia evaluation and evaluation for vitamin deficiencies especially vitamin D and vitamin B12.  She is furthermore advised to talk to you about seeing a  cardiologist and ENT.  I would recommend this.  She is advised that her neurological exam is nonfocal which is of course reassuring.  I would recommend that we look with a brain MRI with and without contrast to make sure there is no structural cause of her symptoms.  We can compare findings with her brain scan from 2020 as well.  She is advised to  check with your office regarding her antidepressant medications.  She is likely only on one SSRI type medication.  She is also encouraged to talk to her GYN about Nexplanon and potential side effects.  She reports weight gain.  I initially considered amitriptyline but given that she is already on Maxalt as needed as well as SSRI type antidepressant, I would not like to add a tricyclic antidepressant for fear of increasing serotonin effects and for the rare possibility of serotonin syndrome.  We can consider gabapentin, I would like to proceed with her brain scan first and I would like to encourage her to talk to you about further work-up and referral to ENT and cardiology next.  We talked about the importance of healthy lifestyle, weight management, good hydration.  She is encouraged to increase her water intake. We had previously talked about starting nortriptyline low-dose for migraine management.  She did not pursue this at the time.  We will plan a follow-up in this office in 3 months for her to see one of our nurse practitioners and we will keep her posted as to her MRI results in the interim by phone call.   I answered all their questions today and the patient and her mother were in agreement.  Thank you very much for allowing me to participate in the care of this nice patient. If I can be of any further assistance to you please do not hesitate to call me at [phone].

## 2020-12-14 ENCOUNTER — Telehealth: Payer: Self-pay | Admitting: Neurology

## 2020-12-14 NOTE — Telephone Encounter (Signed)
MR Brain w/wo contrast pending NIA, faxed notes to [phone]. Awaiting determination.

## 2020-12-14 NOTE — Telephone Encounter (Signed)
Tracking #[id number].

## 2020-12-15 NOTE — Telephone Encounter (Signed)
Medicaid [employer] approved MRI. PA #[id number] (12/14/20- 02/12/21). Sent to [facility].

## 2020-12-22 ENCOUNTER — Institutional Professional Consult (permissible substitution): Payer: Medicaid Other | Admitting: Neurology

## 2020-12-24 ENCOUNTER — Ambulatory Visit
Admission: RE | Admit: 2020-12-24 | Discharge: 2020-12-24 | Disposition: A | Discharge status: Home / Self Care | Payer: Medicaid Other | Source: Ambulatory Visit | Attending: Neurology | Admitting: Neurology

## 2020-12-24 ENCOUNTER — Other Ambulatory Visit: Payer: Self-pay

## 2020-12-24 DIAGNOSIS — R42 Dizziness and giddiness: Secondary | ICD-10-CM

## 2020-12-24 DIAGNOSIS — R519 Headache, unspecified: Secondary | ICD-10-CM

## 2020-12-24 DIAGNOSIS — G43809 Other migraine, not intractable, without status migrainosus: Secondary | ICD-10-CM

## 2020-12-24 DIAGNOSIS — H9193 Unspecified hearing loss, bilateral: Secondary | ICD-10-CM

## 2020-12-24 DIAGNOSIS — H9312 Tinnitus, left ear: Secondary | ICD-10-CM

## 2020-12-24 MED ORDER — GADOBENATE DIMEGLUMINE 529 MG/ML IV SOLN
20.0000 mL | Freq: Once | INTRAVENOUS | Status: AC | PRN
  Administered 2020-12-24: 20 mL via INTRAVENOUS

## 2020-12-28 NOTE — Progress Notes (Signed)
MRI brain shows benign and stable findings, no acute findings.  Please update patient.

## 2020-12-29 NOTE — Telephone Encounter (Signed)
Pt called and made aware of MRI results. She verbalized understanding and will keep f/u appt as scheduled for August.

## 2021-01-03 ENCOUNTER — Emergency Department (HOSPITAL_BASED_OUTPATIENT_CLINIC_OR_DEPARTMENT_OTHER)
Admission: EM | Admit: 2021-01-03 | Discharge: 2021-01-03 | Disposition: A | Discharge status: Home / Self Care | Payer: Medicaid Other | Attending: Emergency Medicine | Admitting: Emergency Medicine

## 2021-01-03 ENCOUNTER — Emergency Department (HOSPITAL_BASED_OUTPATIENT_CLINIC_OR_DEPARTMENT_OTHER): Payer: Medicaid Other

## 2021-01-03 ENCOUNTER — Ambulatory Visit
Admission: EM | Admit: 2021-01-03 | Discharge: 2021-01-03 | Disposition: A | Discharge status: Nursing Home (Includes ICF) | Payer: Medicaid Other | Attending: Student | Admitting: Student

## 2021-01-03 ENCOUNTER — Encounter (HOSPITAL_BASED_OUTPATIENT_CLINIC_OR_DEPARTMENT_OTHER): Payer: Self-pay | Admitting: *Deleted

## 2021-01-03 ENCOUNTER — Other Ambulatory Visit: Payer: Self-pay

## 2021-01-03 DIAGNOSIS — J45909 Unspecified asthma, uncomplicated: Secondary | ICD-10-CM | POA: Diagnosis not present

## 2021-01-03 DIAGNOSIS — Z3202 Encounter for pregnancy test, result negative: Secondary | ICD-10-CM

## 2021-01-03 DIAGNOSIS — K589 Irritable bowel syndrome without diarrhea: Secondary | ICD-10-CM

## 2021-01-03 DIAGNOSIS — R1031 Right lower quadrant pain: Secondary | ICD-10-CM | POA: Diagnosis present

## 2021-01-03 DIAGNOSIS — N83201 Unspecified ovarian cyst, right side: Secondary | ICD-10-CM

## 2021-01-03 DIAGNOSIS — K219 Gastro-esophageal reflux disease without esophagitis: Secondary | ICD-10-CM | POA: Diagnosis not present

## 2021-01-03 LAB — COMPREHENSIVE METABOLIC PANEL
ALT: 14 U/L (ref 0–44)
AST: 14 U/L — ABNORMAL LOW (ref 15–41)
Albumin: 4.4 g/dL (ref 3.5–5.0)
Alkaline Phosphatase: 50 U/L (ref 38–126)
Anion gap: 9 (ref 5–15)
BUN: 12 mg/dL (ref 6–20)
CO2: 26 mmol/L (ref 22–32)
Calcium: 9.4 mg/dL (ref 8.9–10.3)
Chloride: 105 mmol/L (ref 98–111)
Creatinine, Ser: 0.72 mg/dL (ref 0.44–1.00)
GFR, Estimated: >60 mL/min (ref >60)
Glucose, Bld: 81 mg/dL (ref 70–99)
Potassium: 3.7 mmol/L (ref 3.5–5.1)
Sodium: 140 mmol/L (ref 135–145)
Total Bilirubin: 0.3 mg/dL (ref 0.3–1.2)
Total Protein: 7.6 g/dL (ref 6.5–8.1)

## 2021-01-03 LAB — POCT URINALYSIS DIP (MANUAL ENTRY)
Blood, UA: NEGATIVE
Glucose, UA: NEGATIVE mg/dL
Leukocytes, UA: NEGATIVE
Nitrite, UA: NEGATIVE
Protein Ur, POC: 100 mg/dL — AB
Spec Grav, UA: >=1.03 — AB (ref 1.010–1.025)
Urobilinogen, UA: 0.2 E.U./dL
pH, UA: 6.5 (ref 5.0–8.0)

## 2021-01-03 LAB — URINALYSIS, ROUTINE W REFLEX MICROSCOPIC
Bilirubin Urine: NEGATIVE
Glucose, UA: NEGATIVE mg/dL
Hgb urine dipstick: NEGATIVE
Ketones, ur: NEGATIVE mg/dL
Leukocytes,Ua: NEGATIVE
Nitrite: NEGATIVE
Protein, ur: 30 mg/dL — AB
Specific Gravity, Urine: 1.034 — ABNORMAL HIGH (ref 1.005–1.030)
pH: 6.5 (ref 5.0–8.0)

## 2021-01-03 LAB — CBC
HCT: 38 % (ref 36.0–46.0)
Hemoglobin: 12.6 g/dL (ref 12.0–15.0)
MCH: 29.5 pg (ref 26.0–34.0)
MCHC: 33.2 g/dL (ref 30.0–36.0)
MCV: 89 fL (ref 80.0–100.0)
Platelets: 345 10*3/uL (ref 150–400)
RBC: 4.27 MIL/uL (ref 3.87–5.11)
RDW: 12.8 % (ref 11.5–15.5)
WBC: 6.4 10*3/uL (ref 4.0–10.5)
nRBC: 0 % (ref 0.0–0.2)

## 2021-01-03 LAB — POCT URINE PREGNANCY: Preg Test, Ur: NEGATIVE

## 2021-01-03 LAB — LIPASE, BLOOD: Lipase: 179 U/L — ABNORMAL HIGH (ref 11–51)

## 2021-01-03 MED ORDER — OMEPRAZOLE 20 MG PO CPDR: 20.0000 mg | DELAYED_RELEASE_CAPSULE | Freq: Every day | ORAL | 0 refills | Status: AC

## 2021-01-03 NOTE — ED Provider Notes (Signed)
[facility] EMERGENCY DEPT Provider Note   CSN: [id number] Arrival date & time: 01/03/21  1649     History Chief Complaint  Patient presents with   Abdominal Pain    Patricia Davenport is a 20 y.o. female.  HPI Patient is sent from the urgent care for further diagnostic evaluation.  Patient has been having right lower abdominal pain for about 2 weeks.  It has been coming and going but predominantly present.  She reports she has IBS and is used to having abdominal pain but this is different.  She reports is very uncomfortable if there is pressure in her right lower abdomen.  She reports that she did have a somewhat abnormal menstrual cycle which finished a few days ago.  She reports normally she has about a week or more menstrual cycle but this time she only had about 3 days.  No abnormal vaginal discharge or bleeding.  Patient did recently had Nexplanon removed.  She is sexually active with her boyfriend    Past Medical History:  Diagnosis Date   Anal fissure    Anxiety    Asthma    childhood   Eczema    GERD (gastroesophageal reflux disease)    History of anxiety    IBS (irritable bowel syndrome)     Patient Active Problem List   Diagnosis Date Noted   Nausea with vomiting 06/03/2018   Obesity 06/03/2018    Past Surgical History:  Procedure Laterality Date   ABCESS DRAINAGE     ADENOIDECTOMY     TONSILLECTOMY     WISDOM TOOTH EXTRACTION       OB History     Gravida  0   Para  0   Term  0   Preterm  0   AB  0   Living  0      SAB  0   IAB  0   Ectopic  0   Multiple  0   Live Births  0           Family History  Problem Relation Age of Onset   Thyroid disease Mother    GER disease Mother    Hypertension Father    GER disease Father    Hypertension Maternal Grandmother    Diabetes Maternal Grandmother    Hypertension Paternal Grandmother    Hypertension Paternal Grandfather    Irritable bowel syndrome Neg Hx    Colitis Neg  Hx     Social History   Tobacco Use   Smoking status: Never   Smokeless tobacco: Never  Vaping Use   Vaping Use: Never used  Substance Use Topics   Alcohol use: No   Drug use: No    Home Medications Prior to Admission medications   Medication Sig Start Date End Date Taking? Authorizing Provider  escitalopram (LEXAPRO) 20 MG tablet Take 20 mg by mouth daily.    [provider]  etonogestrel (NEXPLANON) 68 MG IMPL implant 1 each by Subdermal route once.    [provider]  fluconazole (DIFLUCAN) 150 MG tablet Take 1 tablet (150 mg total) by mouth once a week. 09/24/20   Wallis Bamberg, PA-C  hydrOXYzine (ATARAX/VISTARIL) 25 MG tablet Take 0.5-1 tablets (12.5-25 mg total) by mouth every 8 (eight) hours as needed for itching. 09/24/20   Wallis Bamberg, PA-C  pantoprazole (PROTONIX) 20 MG tablet Take 1 tablet (20 mg total) by mouth daily. 02/26/20   Hall-Potvin, [place], PA-C  rizatriptan (MAXALT-MLT) 5 MG disintegrating  tablet PLEASE SEE ATTACHED FOR DETAILED DIRECTIONS 08/05/18   [provider]  sertraline (ZOLOFT) 100 MG tablet Take 100 mg by mouth daily.    [provider]  VALACYCLOVIR HCL PO Take by mouth.    [provider]  norgestimate-ethinyl estradiol (ORTHO-CYCLEN) 0.25-35 MG-MCG tablet Take 1 tablet by mouth daily. 01/13/19 02/26/20  Constant, Peggy, MD  propranolol ER (INDERAL LA) 60 MG 24 hr capsule TAKE 1 CAPSULE BY MOUTH EVERY MORNING FOR PREVENTION OF MIGRAINE SYMPTOMS 06/26/18 02/26/20  [provider]    Allergies    Patient has no known allergies.  Review of Systems   Review of Systems 10 systems reviewed and negative except as per HPI Physical Exam Updated Vital Signs BP (!) 118/101 (BP Location: Right Arm)   Pulse (!) 58   Temp 98.8 F (37.1 C)   Resp 17   Ht 5\' 1"  (1.549 m)   Wt 88.9 kg   LMP 12/27/2020   SpO2 100%   BMI 37.03 kg/m   Physical Exam Constitutional:      Comments: Alert nontoxic.  Clinically well  in appearance.  HENT:     Head: Normocephalic and atraumatic.  Eyes:     Extraocular Movements: Extraocular movements intact.  Cardiovascular:     Rate and Rhythm: Normal rate and regular rhythm.  Pulmonary:     Effort: Pulmonary effort is normal.     Breath sounds: Normal breath sounds.  Abdominal:     Comments: Abdomen soft without guarding.  Patient significant right lower quadrant tenderness to palpation.  No palpable mass.  Musculoskeletal:        General: No swelling or tenderness. Normal range of motion.     Right lower leg: No edema.     Left lower leg: No edema.  Skin:    General: Skin is warm and dry.  Neurological:     General: No focal deficit present.     Mental Status: She is oriented to person, place, and time.     Coordination: Coordination normal.  Psychiatric:        Mood and Affect: Mood normal.    ED Results / Procedures / Treatments   Labs (all labs ordered are listed, but only abnormal results are displayed) Labs Reviewed  LIPASE, BLOOD - Abnormal; Notable for the following components:      Result Value   Lipase 179 (*)    All other components within normal limits  COMPREHENSIVE METABOLIC PANEL - Abnormal; Notable for the following components:   AST 14 (*)    All other components within normal limits  URINALYSIS, ROUTINE W REFLEX MICROSCOPIC - Abnormal; Notable for the following components:   APPearance HAZY (*)    Specific Gravity, Urine 1.034 (*)    Protein, ur 30 (*)    All other components within normal limits  CBC    EKG None  Radiology No results found.  Procedures Procedures   Medications Ordered in ED Medications - No data to display  ED Course  I have reviewed the triage vital signs and the nursing notes.  Pertinent labs & imaging results that were available during my care of the patient were reviewed by me and considered in my medical decision making (see chart for details).    MDM Rules/Calculators/A&P                           CT scan does not show any acute findings.  Patient does have mild elevation in the lipase.  This does not correlate with any symptoms.  She has no upper quadrant tenderness or epigastric tenderness.  Patient does describe chronic and persistent nausea longstanding.  At this time recommend follow-up with PCP to discuss possibility of subacute gallbladder disease.  Also will start omeprazole daily for possible reflux.  Follow-up plan and return precautions reviewed. Final Clinical Impression(s) / ED Diagnoses Final diagnoses:  Right lower quadrant pain    Rx / DC Orders ED Discharge Orders     None        Arby Barrette, MD 01/03/21 2308

## 2021-01-03 NOTE — ED Notes (Signed)
Patient is being discharged from the Urgent Care Center and sent to the Emergency Department via private vehicle with family. Per L. Cheree Ditto, [place], patient is stable but in need of higher level of care due to abd pain with concern for possible enlarged or ruptured ovarian cyst. Patient is aware and verbalizes understanding of plan of care.  Vitals:   01/03/21 1516  BP: 115/69  Pulse: 60  Resp: 16  Temp: 98.2 F (36.8 C)  SpO2: 99%

## 2021-01-03 NOTE — Discharge Instructions (Addendum)
1.  Your CT scan does not show any abnormalities. 2.  A lab value called the lipase is slightly elevated.  This needs to be rechecked by your doctor.  At this time you do not have any symptoms of the condition called pancreatitis which is often associated with this.  However, gallbladder problems could also potentially cause an increase.  Discussed follow-up HIDA scan and ultrasound with your doctor.  Follow a very low-fat diet. 3.  You are being started on a medication called omeprazole.  This is a medication for reflux.  You are taking this because you have had persistent and recurrent nausea.  Sometimes this is a symptom of reflux.  Take this medication for a month and discuss it with your physician 4.  Return to emergency department if you are worsening or change in condition.

## 2021-01-03 NOTE — ED Triage Notes (Signed)
Patient presents to Urgent Care with complaints of abdominal pain x 2 weeks. She reports a sharp pain with movement or standing. She reports increased urinary frequency x 2 days ago. She states she was bit by insect and unsure if related to bite. Pt states she has a hx of IBS. Treating pain with aleve.   Denies fever or hematuria.

## 2021-01-03 NOTE — ED Provider Notes (Signed)
[facility]    CSN: [id number] Arrival date & time: 01/03/21  1242      History   Chief Complaint Chief Complaint  Patient presents with   Insect Bite   Abdominal Pain    HPI Patricia Davenport is a 20 y.o. female presenting with abdominal pain and insect bite. Medical history ovarian cyst, IBS, GERD, eczema, asthma, anxiety, anal fissure.  Notes RLQ abd pain for 2 weeks, worsening of pain with movement and sitting.  Now also with urinary frequency for 2 days.  States she is concerned that her ovarian cyst is acting up, her primary care told her to come here.  Denies change in bowel or bladder function, last bowel movement was this morning and was normal.  Denies new partners, denies vaginal discharge, vaginal lesions, vaginal rashes.  Denies urinary urgency, dysuria, hematuria.  Denies flank pain. She does note that she was also bit by an insect recently but states this seems to be healing well on its own. Happens to be of the skin overlying her RLQ. She still has her appendix and gallbladder.  HPI  Past Medical History:  Diagnosis Date   Anal fissure    Anxiety    Asthma    childhood   Eczema    GERD (gastroesophageal reflux disease)    History of anxiety    IBS (irritable bowel syndrome)     Patient Active Problem List   Diagnosis Date Noted   Nausea with vomiting 06/03/2018   Obesity 06/03/2018    Past Surgical History:  Procedure Laterality Date   ABCESS DRAINAGE     ADENOIDECTOMY     TONSILLECTOMY     WISDOM TOOTH EXTRACTION      OB History     Gravida  0   Para  0   Term  0   Preterm  0   AB  0   Living  0      SAB  0   IAB  0   Ectopic  0   Multiple  0   Live Births  0            Home Medications    Prior to Admission medications   Medication Sig Start Date End Date Taking? Authorizing Provider  escitalopram (LEXAPRO) 20 MG tablet Take 20 mg by mouth daily.    [provider]  etonogestrel (NEXPLANON) 68 MG  IMPL implant 1 each by Subdermal route once.    [provider]  fluconazole (DIFLUCAN) 150 MG tablet Take 1 tablet (150 mg total) by mouth once a week. 09/24/20   Wallis Bamberg, PA-C  hydrOXYzine (ATARAX/VISTARIL) 25 MG tablet Take 0.5-1 tablets (12.5-25 mg total) by mouth every 8 (eight) hours as needed for itching. 09/24/20   Wallis Bamberg, PA-C  pantoprazole (PROTONIX) 20 MG tablet Take 1 tablet (20 mg total) by mouth daily. 02/26/20   Hall-Potvin, [place], PA-C  rizatriptan (MAXALT-MLT) 5 MG disintegrating tablet PLEASE SEE ATTACHED FOR DETAILED DIRECTIONS 08/05/18   [provider]  sertraline (ZOLOFT) 100 MG tablet Take 100 mg by mouth daily.    [provider]  VALACYCLOVIR HCL PO Take by mouth.    [provider]  norgestimate-ethinyl estradiol (ORTHO-CYCLEN) 0.25-35 MG-MCG tablet Take 1 tablet by mouth daily. 01/13/19 02/26/20  Constant, Peggy, MD  propranolol ER (INDERAL LA) 60 MG 24 hr capsule TAKE 1 CAPSULE BY MOUTH EVERY MORNING FOR PREVENTION OF MIGRAINE SYMPTOMS 06/26/18 02/26/20  [provider]  Family History Family History  Problem Relation Age of Onset   Thyroid disease Mother    GER disease Mother    Hypertension Father    GER disease Father    Hypertension Maternal Grandmother    Diabetes Maternal Grandmother    Hypertension Paternal Grandmother    Hypertension Paternal Grandfather    Irritable bowel syndrome Neg Hx    Colitis Neg Hx     Social History Social History   Tobacco Use   Smoking status: Never   Smokeless tobacco: Never  Vaping Use   Vaping Use: Never used  Substance Use Topics   Alcohol use: No   Drug use: No     Allergies   Patient has no known allergies.   Review of Systems Review of Systems  Constitutional:  Negative for appetite change, chills, diaphoresis and fever.  Respiratory:  Negative for shortness of breath.   Cardiovascular:  Negative for chest pain.  Gastrointestinal:  Positive for  abdominal pain. Negative for blood in stool, constipation, diarrhea, nausea and vomiting.  Genitourinary:  Positive for frequency. Negative for decreased urine volume, difficulty urinating, dysuria, flank pain, genital sores, hematuria and urgency.  Musculoskeletal:  Negative for back pain.  Skin:        Insect bite  Neurological:  Negative for dizziness, weakness and light-headedness.  All other systems reviewed and are negative.   Physical Exam Triage Vital Signs ED Triage Vitals  Enc Vitals Group     BP 01/03/21 1516 115/69     Pulse Rate 01/03/21 1516 60     Resp 01/03/21 1516 16     Temp --      Temp Source 01/03/21 1516 Oral     SpO2 01/03/21 1516 99 %     Weight --      Height --      Head Circumference --      Peak Flow --      Pain Score 01/03/21 1514 3     Pain Loc --      Pain Edu? --      Excl. in GC? --    No data found.  Updated Vital Signs BP 115/69 (BP Location: Left Arm)   Pulse 60   Temp 98.2 F (36.8 C) (Oral)   Resp 16   LMP 12/27/2020   SpO2 99%   Visual Acuity Right Eye Distance:   Left Eye Distance:   Bilateral Distance:    Right Eye Near:   Left Eye Near:    Bilateral Near:     Physical Exam Vitals reviewed.  Constitutional:      General: She is not in acute distress.    Appearance: Normal appearance. She is not ill-appearing.  HENT:     Head: Normocephalic and atraumatic.     Mouth/Throat:     Mouth: Mucous membranes are moist.     Comments: Moist mucous membranes Eyes:     Extraocular Movements: Extraocular movements intact.     Pupils: Pupils are equal, round, and reactive to light.  Cardiovascular:     Rate and Rhythm: Normal rate and regular rhythm.     Heart sounds: Normal heart sounds.  Pulmonary:     Effort: Pulmonary effort is normal.     Breath sounds: Normal breath sounds. No wheezing, rhonchi or rales.  Abdominal:     General: Bowel sounds are normal. There is no distension.     Palpations: Abdomen is soft. There  is no mass.  Tenderness: There is abdominal tenderness in the right lower quadrant. There is guarding and rebound. There is no right CVA tenderness or left CVA tenderness.     Comments: RLQ is significantly TTP with some rebound and guarding. BS are full throughout. No CVAT.  Skin:    General: Skin is warm.     Capillary Refill: Capillary refill takes less than 2 seconds.     Comments: Good skin turgor There is a tiny 15mm scab from insect bite on skin overlying RLQ. Area is nontender with no erythema warmth discharge fluctuence or inducation.   Neurological:     General: No focal deficit present.     Mental Status: She is alert and oriented to person, place, and time.  Psychiatric:        Mood and Affect: Mood normal.        Behavior: Behavior normal.     UC Treatments / Results  Labs (all labs ordered are listed, but only abnormal results are displayed) Labs Reviewed  POCT URINALYSIS DIP (MANUAL ENTRY) - Abnormal; Notable for the following components:      Result Value   Bilirubin, UA small (*)    Ketones, POC UA trace (5) (*)    Spec Grav, UA >=1.030 (*)    Protein Ur, POC =100 (*)    All other components within normal limits  POCT URINE PREGNANCY    EKG   Radiology No results found.  Procedures Procedures (including critical care time)  Medications Ordered in UC Medications - No data to display  Initial Impression / Assessment and Plan / UC Course  I have reviewed the triage vital signs and the nursing notes.  Pertinent labs & imaging results that were available during my care of the patient were reviewed by me and considered in my medical decision making (see chart for details).     This patient is a 20 year old female presenting with significant RLQ pain, with a history of ovarian cyst.  Afebrile, nontachycardic. Long history IBS.   UA with negative blood, negative nitrite, negative leuk. Negative pregnancy test.   I do have concern for ovarian cyst  enlargement or rupture given history and exam. This patient also does still have her appendix. I am recommending she head straight to ED for further evaluation and management.  She is hemodynamically stable for transport in personal vehicle at this time.  Final Clinical Impressions(s) / UC Diagnoses   Final diagnoses:  Cyst of right ovary  RLQ abdominal pain  Irritable bowel syndrome without diarrhea  Negative pregnancy test     Discharge Instructions      -Please head to the ED for further evaluation and management of abdominal pain. I'm concerned you have an ovarian cyst that is enlarged or ruptured. This needs imaging and possibly emergent management and can be deadly if untreated.  -If you develop new symptoms like dizziness, weakness, worsening abdominal pain while on the way- stop and call 911 immediately.     ED Prescriptions   None    PDMP not reviewed this encounter.   Rhys Martini, PA-C 01/03/21 1644

## 2021-01-03 NOTE — Discharge Instructions (Addendum)
-  Please head to the ED for further evaluation and management of abdominal pain. I'm concerned you have an ovarian cyst that is enlarged or ruptured. This needs imaging and possibly emergent management and can be deadly if untreated.  -If you develop new symptoms like dizziness, weakness, worsening abdominal pain while on the way- stop and call 911 immediately.

## 2021-01-03 NOTE — ED Triage Notes (Signed)
Abd pain for a week and half, Went to UC, sent here for possible ovarian cyst.

## 2021-03-02 ENCOUNTER — Encounter (HOSPITAL_COMMUNITY): Payer: Self-pay | Admitting: *Deleted

## 2021-03-02 ENCOUNTER — Emergency Department (HOSPITAL_COMMUNITY): Payer: Medicaid Other | Attending: Physician Assistant

## 2021-03-02 ENCOUNTER — Emergency Department (HOSPITAL_COMMUNITY)
Admission: EM | Admit: 2021-03-02 | Discharge: 2021-03-02 | Disposition: A | Discharge status: Home / Self Care | Payer: No Typology Code available for payment source | Attending: Physician Assistant | Admitting: Physician Assistant

## 2021-03-02 ENCOUNTER — Other Ambulatory Visit: Payer: Self-pay

## 2021-03-02 DIAGNOSIS — J45909 Unspecified asthma, uncomplicated: Secondary | ICD-10-CM | POA: Insufficient documentation

## 2021-03-02 DIAGNOSIS — W228XXA Striking against or struck by other objects, initial encounter: Secondary | ICD-10-CM | POA: Insufficient documentation

## 2021-03-02 DIAGNOSIS — S0990XA Unspecified injury of head, initial encounter: Secondary | ICD-10-CM | POA: Insufficient documentation

## 2021-03-02 MED ORDER — NAPROXEN 500 MG PO TABS: 500.0000 mg | ORAL_TABLET | Freq: Two times a day (BID) | ORAL | 0 refills | Status: AC

## 2021-03-02 NOTE — ED Notes (Signed)
RN reviewed discharge instructions w/ pt. Follow up and medications reviewed, pt had no further questions °
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
## 2021-03-02 NOTE — Discharge Instructions (Addendum)
You may alternate taking Tylenol and Naproxen as needed for pain control. You may take Naproxen twice daily as directed on your discharge paperwork and you may take  [phone] mg of Tylenol every 6 hours. Do not exceed 4000 mg of Tylenol daily as this can lead to liver damage. Also, make sure to take Naproxen with meals as it can cause an upset stomach. Do not take other NSAIDs while taking Naproxen such as (Aleve, Ibuprofen, Aspirin, Celebrex, etc) and do not take more than the prescribed dose as this can lead to ulcers and bleeding in your GI tract. You may use warm and cold compresses to help with your symptoms.   Please follow up with the [facility]ussion [facility] within the next 7-10 days for re-evaluation and further treatment of your symptoms.   Please return to the ER sooner if you have any new or worsening symptoms.

## 2021-03-02 NOTE — ED Notes (Signed)
Pt taken back to Triage to speak with PA regarding CT results

## 2021-03-09 ENCOUNTER — Encounter: Payer: Self-pay | Admitting: Family Medicine

## 2021-03-09 ENCOUNTER — Ambulatory Visit: Payer: Medicaid Other | Admitting: Family Medicine

## 2021-03-09 NOTE — Progress Notes (Deleted)
No chief complaint on file.    HISTORY OF PRESENT ILLNESS: 03/09/21 ALL:  Patricia Davenport is a 20 y.o. female here today for follow up for dizziness. She was seen in consult with Dr Frances Furbish 11/2020 and advised to follow up with her PCP for blood work and consideration of ENT/cardiology referrals. MRI was normal. Nortriptyline suggested for migraines but she declined.     HISTORY (copied from Dr Teofilo Pod previous note)  Dear Larita Fife,    I saw your patient, Patricia Davenport, upon your kind request in my neurologic clinic today for initial consultation of her dizziness, concern for vertigo. The patient is accompanied by her mother and her brother today. As you know, [name] is a 20 year old female with a history of migraine headaches, constipation, anxiety, and obesity, who reports approximately 1 month history of recurrent dizziness.  Sometimes the dizziness feels like a swaying or spinning sensation, sometimes it is positional with lightheadedness and palpitations.  Sometimes she feels a chest tightness and feels like she is going to faint.  She feels sweaty and weak at times.  She has had associated headaches.  She is no longer on propranolol or Topamax.  She has been on [place] as needed and Maxalt as needed as well as ondansetron as needed.  She has seen GI in the past for nausea and vomiting and had endoscopies.  She has not had any recent falls.  Reports that she went to physical therapy but they felt she did not have vertigo.  She has not had any recent blood work.  Mom is wondering if she has a thyroid dysfunction since mom had hyperthyroidism.  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  Patient reports hearing loss in both ears around the same time.  She also reports intermittent ringing in the left more than right ear.  She has not seen ENT or cardiology.  She does not sleep well.  She is currently not working.  She is on  Lexapro.  Of note, she also indicates being on sertraline but your records do not indicate sertraline.  She is wondering if she is having a side effect on Nexplanon.  She has not talked to her GYN about this yet.  She does not drink caffeine daily.  She drinks approximately 2 bottles of water per day.  She does not drink any alcohol.  Her headaches are associated with light sensitivity.   She has prescription eyeglasses.  She had her last eye exam in December 2021.   I reviewed your office note from 10/26/2020.  He was treated symptomatically with meclizine and a referral to physical therapy was discussed.  She was also given ondansetron for nausea.  She was referred to physical therapy on 11/10/2020. The physical therapist felt that her symptoms were not in keeping with positional vertigo and was concerned about a central cause of her vertigo.   I had previously evaluated her over 2 years ago for intractable nausea and vomiting, concern for migraines.  We proceeded with a sleep study and a brain scan.  She had a brain MRI with and without contrast on 08/11/2018 and I reviewed the results:  IMPRESSION: This MRI of the brain with and without contrast shows the following: 1.   The brain appears normal before and after contrast. 2.   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   We had called her  with her test results.   She had a baseline sleep study on 08/30/2018 which showed no significant obstructive sleep apnea.  Overall AHI was 0/h.  Average oxygen saturation was 98%, nadir was 94%.  She had no significant PLM's.  She had very little snoring.  Sleep latency was 25 minutes, REM latency mildly delayed at 144.5 minutes.  Sleep efficiency was 90.8%.  She had a mildly increased percentage of stage II sleep, a mildly increased percentage of slow-wave sleep and Reduced percentage of REM sleep.     Previously:    07/31/18: 20 year old right-handed woman with an  underlying medical history of intractable nausea and vomiting, obesity, and childhood asthma, who reports recent onset of intractable nausea and vomiting, starting in or around July 2019. I reviewed your office note from 07/19/2018, which you kindly included. For migraine headaches she has been tried on rizatriptan and propranolol. She had side effects on the propranolol including dizziness and lightheadedness, a syncopal episode was reported at school as well. She has had some intermittent palpitations. She has had significant nausea and vomiting and had workup through GI for this and follow-up is scheduled, she may need an upper GI endoscopy. She has had an abdominal ultrasound and gastric emptying test recently with unremarkable findings. She was recently switched to topiramate for migraine prevention. She denies any history of recurrent headaches, she has had some recent photophobia. She has not had any similar symptoms growing up, no history of abdominal migraines as a child. She had childhood asthma but outgrew them. She has a history of RSV as an infant. She has a family history of migraines affecting her father and sister and she has a family history of stroke and sleep apnea in her maternal grandmother. She has gained weight. She does feel tired during the day and has a history of snoring. She developed some headaches as a result of trying migraine medications she states. She could not tolerate the Topamax or propranolol. She is currently not on any of these. She has a GI follow-up pending and may have a upper GI endoscopy from what I understand. She has no history of febrile seizures or epilepsy. She has been trying to get enough sleep, she tries to hydrate with water estimates that she drinks about 3 bottles of water, soda about 2-3 per day. Bedtime is around 10 but sometimes as late as midnight when she has swim practice. Rise time between 6 and 7. She had a tonsillectomy and adenoidectomy in elementary  school.    REVIEW OF SYSTEMS: Out of a complete 14 system review of symptoms, the patient complains only of the following symptoms, and all other reviewed systems are negative.   ALLERGIES: No Known Allergies   HOME MEDICATIONS: Outpatient Medications Prior to Visit  Medication Sig Dispense Refill   escitalopram (LEXAPRO) 20 MG tablet Take 20 mg by mouth daily.     etonogestrel (NEXPLANON) 68 MG IMPL implant 1 each by Subdermal route once.     fluconazole (DIFLUCAN) 150 MG tablet Take 1 tablet (150 mg total) by mouth once a week. 4 tablet 0   hydrOXYzine (ATARAX/VISTARIL) 25 MG tablet Take 0.5-1 tablets (12.5-25 mg total) by mouth every 8 (eight) hours as needed for itching. 30 tablet 0   naproxen (NAPROSYN) 500 MG tablet Take 1 tablet (500 mg total) by mouth 2 (two) times daily for 7 days. 14 tablet 0   omeprazole (PRILOSEC) 20 MG capsule Take 1 capsule (20 mg total) by mouth daily.  30 capsule 0   pantoprazole (PROTONIX) 20 MG tablet Take 1 tablet (20 mg total) by mouth daily. 30 tablet 0   rizatriptan (MAXALT-MLT) 5 MG disintegrating tablet PLEASE SEE ATTACHED FOR DETAILED DIRECTIONS     sertraline (ZOLOFT) 100 MG tablet Take 100 mg by mouth daily.     VALACYCLOVIR HCL PO Take by mouth.     No facility-administered medications prior to visit.     PAST MEDICAL HISTORY: Past Medical History:  Diagnosis Date   Anal fissure    Anxiety    Asthma    childhood   Eczema    GERD (gastroesophageal reflux disease)    History of anxiety    IBS (irritable bowel syndrome)      PAST SURGICAL HISTORY: Past Surgical History:  Procedure Laterality Date   ABCESS DRAINAGE     ADENOIDECTOMY     TONSILLECTOMY     WISDOM TOOTH EXTRACTION       FAMILY HISTORY: Family History  Problem Relation Age of Onset   Thyroid disease Mother    GER disease Mother    Hypertension Father    GER disease Father    Hypertension Maternal Grandmother    Diabetes Maternal Grandmother     Hypertension Paternal Grandmother    Hypertension Paternal Grandfather    Irritable bowel syndrome Neg Hx    Colitis Neg Hx      SOCIAL HISTORY: Social History   Socioeconomic History   Marital status: Single    Spouse name: Not on file   Number of children: Not on file   Years of education: Not on file   Highest education level: Not on file  Occupational History   Not on file  Tobacco Use   Smoking status: Never   Smokeless tobacco: Never  Vaping Use   Vaping Use: Never used  Substance and Sexual Activity   Alcohol use: No   Drug use: No   Sexual activity: Not Currently    Birth control/protection: Implant  Other Topics Concern   Not on file  Social History Narrative   12th grade- [employer]. Lives with mom   Social Determinants of Health   Financial Resource Strain: Not on file  Food Insecurity: Not on file  Transportation Needs: Not on file  Physical Activity: Not on file  Stress: Not on file  Social Connections: Not on file  Intimate Partner Violence: Not on file     PHYSICAL EXAM  There were no vitals filed for this visit. There is no height or weight on file to calculate BMI.   Generalized: Well developed, in no acute distress  Cardiology: normal rate and rhythm, no murmur auscultated  Respiratory: clear to auscultation bilaterally    Neurological examination  Mentation: Alert oriented to time, place, history taking. Follows all commands speech and language fluent Cranial nerve II-XII: Pupils were equal round reactive to light. Extraocular movements were full, visual field were full on confrontational test. Facial sensation and strength were normal. Uvula tongue midline. Head turning and shoulder shrug  were normal and symmetric. Motor: The motor testing reveals 5 over 5 strength of all 4 extremities. Good symmetric motor tone is noted throughout.  Sensory: Sensory testing is intact to soft touch on all 4 extremities. No evidence of extinction  is noted.  Coordination: Cerebellar testing reveals good finger-nose-finger and heel-to-shin bilaterally.  Gait and station: Gait is normal. Tandem gait is normal. Romberg is negative. No drift is seen.  Reflexes: Deep tendon reflexes are  symmetric and normal bilaterally.    DIAGNOSTIC DATA (LABS, IMAGING, TESTING) - I reviewed patient records, labs, notes, testing and imaging myself where available.  Lab Results  Component Value Date   WBC 6.4 01/03/2021   HGB 12.6 01/03/2021   HCT 38.0 01/03/2021   MCV 89.0 01/03/2021   PLT 345 01/03/2021      Component Value Date/Time   NA 140 01/03/2021 1800   NA 139 07/31/2018 1217   K 3.7 01/03/2021 1800   CL 105 01/03/2021 1800   CO2 26 01/03/2021 1800   GLUCOSE 81 01/03/2021 1800   BUN 12 01/03/2021 1800   BUN 11 07/31/2018 1217   CREATININE 0.72 01/03/2021 1800   CALCIUM 9.4 01/03/2021 1800   PROT 7.6 01/03/2021 1800   PROT 6.8 07/31/2018 1217   ALBUMIN 4.4 01/03/2021 1800   ALBUMIN 4.3 07/31/2018 1217   AST 14 (L) 01/03/2021 1800   ALT 14 01/03/2021 1800   ALKPHOS 50 01/03/2021 1800   BILITOT 0.3 01/03/2021 1800   BILITOT 0.3 07/31/2018 1217   GFRNONAA >60 01/03/2021 1800   GFRAA >60 03/04/2020 0927   No results found for: CHOL, HDL, LDLCALC, LDLDIRECT, TRIG, CHOLHDL No results found for: ZOXW9UHGBA1C No results found for: VITAMINB12 No results found for: TSH  No flowsheet data found.   No flowsheet data found.   ASSESSMENT AND PLAN  20 y.o. year old female  has a past medical history of Anal fissure, Anxiety, Asthma, Eczema, GERD (gastroesophageal reflux disease), History of anxiety, and IBS (irritable bowel syndrome). here with    No diagnosis found.   No orders of the defined types were placed in this encounter.    No orders of the defined types were placed in this encounter.     Shawnie DapperAmy Camrin Lapre, MSN, FNP-C 03/09/2021, 7:06 AM  [facility] [address], [address] [place], [place] [address] [phone]  [phone]

## 2022-01-03 ENCOUNTER — Emergency Department (HOSPITAL_BASED_OUTPATIENT_CLINIC_OR_DEPARTMENT_OTHER): Payer: Medicaid Other

## 2022-01-03 ENCOUNTER — Encounter (HOSPITAL_BASED_OUTPATIENT_CLINIC_OR_DEPARTMENT_OTHER): Payer: Self-pay | Admitting: Emergency Medicine

## 2022-01-03 ENCOUNTER — Other Ambulatory Visit: Payer: Self-pay

## 2022-01-03 DIAGNOSIS — N3001 Acute cystitis with hematuria: Secondary | ICD-10-CM | POA: Insufficient documentation

## 2022-01-03 DIAGNOSIS — R109 Unspecified abdominal pain: Secondary | ICD-10-CM | POA: Diagnosis present

## 2022-01-03 LAB — COMPREHENSIVE METABOLIC PANEL
ALT: 7 U/L (ref 0–44)
AST: 12 U/L — ABNORMAL LOW (ref 15–41)
Albumin: 4.2 g/dL (ref 3.5–5.0)
Alkaline Phosphatase: 37 U/L — ABNORMAL LOW (ref 38–126)
Anion gap: 10 (ref 5–15)
BUN: 8 mg/dL (ref 6–20)
CO2: 22 mmol/L (ref 22–32)
Calcium: 10.2 mg/dL (ref 8.9–10.3)
Chloride: 107 mmol/L (ref 98–111)
Creatinine, Ser: 0.83 mg/dL (ref 0.44–1.00)
GFR, Estimated: >60 mL/min (ref >60)
Glucose, Bld: 93 mg/dL (ref 70–99)
Potassium: 3.5 mmol/L (ref 3.5–5.1)
Sodium: 139 mmol/L (ref 135–145)
Total Bilirubin: 0.3 mg/dL (ref 0.3–1.2)
Total Protein: 7 g/dL (ref 6.5–8.1)

## 2022-01-03 LAB — CBC
HCT: 33.4 % — ABNORMAL LOW (ref 36.0–46.0)
Hemoglobin: 11.3 g/dL — ABNORMAL LOW (ref 12.0–15.0)
MCH: 30 pg (ref 26.0–34.0)
MCHC: 33.8 g/dL (ref 30.0–36.0)
MCV: 88.6 fL (ref 80.0–100.0)
Platelets: 278 10*3/uL (ref 150–400)
RBC: 3.77 MIL/uL — ABNORMAL LOW (ref 3.87–5.11)
RDW: 12.5 % (ref 11.5–15.5)
WBC: 8.6 10*3/uL (ref 4.0–10.5)
nRBC: 0 % (ref 0.0–0.2)

## 2022-01-03 LAB — URINALYSIS, ROUTINE W REFLEX MICROSCOPIC
Bilirubin Urine: NEGATIVE
Glucose, UA: NEGATIVE mg/dL
Nitrite: NEGATIVE
Protein, ur: 100 mg/dL — AB
RBC / HPF: >50 RBC/hpf — ABNORMAL HIGH (ref 0–5)
Specific Gravity, Urine: 1.018 (ref 1.005–1.030)
WBC, UA: >50 WBC/hpf — ABNORMAL HIGH (ref 0–5)
pH: 7 (ref 5.0–8.0)

## 2022-01-03 LAB — LIPASE, BLOOD: Lipase: 23 U/L (ref 11–51)

## 2022-01-03 LAB — PREGNANCY, URINE: Preg Test, Ur: NEGATIVE

## 2022-01-03 MED ORDER — IOHEXOL 300 MG/ML  SOLN
100.0000 mL | Freq: Once | INTRAMUSCULAR | Status: AC | PRN
  Administered 2022-01-03: 75 mL via INTRAVENOUS

## 2022-01-03 NOTE — ED Triage Notes (Signed)
10/10 abdominal pain into mid back, started a few days ago but has gotten worse.  Endorses some diarrhea and nausea but no vomiting.  Has taken otc pain medications with no relief

## 2022-01-04 ENCOUNTER — Emergency Department (HOSPITAL_BASED_OUTPATIENT_CLINIC_OR_DEPARTMENT_OTHER)
Admission: EM | Admit: 2022-01-04 | Discharge: 2022-01-04 | Disposition: A | Discharge status: Home / Self Care | Payer: Medicaid Other | Attending: Emergency Medicine | Admitting: Emergency Medicine

## 2022-01-04 DIAGNOSIS — N3001 Acute cystitis with hematuria: Secondary | ICD-10-CM

## 2022-01-04 DIAGNOSIS — R1084 Generalized abdominal pain: Secondary | ICD-10-CM

## 2022-01-04 MED ORDER — CEPHALEXIN 250 MG PO CAPS
1000.0000 mg | ORAL_CAPSULE | Freq: Once | ORAL | Status: AC
  Administered 2022-01-04: 1000 mg via ORAL
  Filled 2022-01-04: qty 4

## 2022-01-04 MED ORDER — CEPHALEXIN 500 MG PO CAPS: ORAL_CAPSULE | ORAL | 0 refills | Status: AC

## 2022-01-04 MED ORDER — ONDANSETRON 4 MG PO TBDP: ORAL_TABLET | ORAL | 0 refills | Status: DC

## 2022-01-04 NOTE — ED Provider Notes (Signed)
[facility]   CSN: [id number] Arrival date & time: 01/03/22  2145     History  Chief Complaint  Patient presents with   Abdominal Pain    Patricia Davenport is a 21 y.o. female.  21 yo F with a chief complaints of abdominal discomfort.  This is diffuse and colicky.  Has been associated with constipation.  She has not really had a good bowel movement in about a week.  She also felt like her urine was not normal.  She was having more frequent urinary events and had some mild discomfort with urination.  She denies any fevers.  Has had some nausea but no vomiting.  Had cramping in the abdomen so severe today that she came to the ED for evaluation.   Abdominal Pain      Home Medications Prior to Admission medications   Medication Sig Start Date End Date Taking? Authorizing Provider  cephALEXin (KEFLEX) 500 MG capsule 2 caps po bid x 7 days 01/04/22  Yes Melene PlanFloyd, Kenniyah Sasaki, DO  ondansetron (ZOFRAN-ODT) 4 MG disintegrating tablet 4mg  ODT q4 hours prn nausea/vomit 01/04/22  Yes Adela LankFloyd, Emmett Arntz, DO  escitalopram (LEXAPRO) 20 MG tablet Take 20 mg by mouth daily.    [provider]  etonogestrel (NEXPLANON) 68 MG IMPL implant 1 each by Subdermal route once.    [provider]  fluconazole (DIFLUCAN) 150 MG tablet Take 1 tablet (150 mg total) by mouth once a week. 09/24/20   Wallis BambergMani, Mario, PA-C  hydrOXYzine (ATARAX/VISTARIL) 25 MG tablet Take 0.5-1 tablets (12.5-25 mg total) by mouth every 8 (eight) hours as needed for itching. 09/24/20   Wallis BambergMani, Mario, PA-C  omeprazole (PRILOSEC) 20 MG capsule Take 1 capsule (20 mg total) by mouth daily. 01/03/21   Arby BarrettePfeiffer, Marcy, MD  pantoprazole (PROTONIX) 20 MG tablet Take 1 tablet (20 mg total) by mouth daily. 02/26/20   Hall-Potvin, [place], PA-C  rizatriptan (MAXALT-MLT) 5 MG disintegrating tablet PLEASE SEE ATTACHED FOR DETAILED DIRECTIONS 08/05/18   [provider]  sertraline (ZOLOFT) 100 MG tablet Take 100 mg  by mouth daily.    [provider]  VALACYCLOVIR HCL PO Take by mouth.    [provider]  norgestimate-ethinyl estradiol (ORTHO-CYCLEN) 0.25-35 MG-MCG tablet Take 1 tablet by mouth daily. 01/13/19 02/26/20  Constant, Peggy, MD  propranolol ER (INDERAL LA) 60 MG 24 hr capsule TAKE 1 CAPSULE BY MOUTH EVERY MORNING FOR PREVENTION OF MIGRAINE SYMPTOMS 06/26/18 02/26/20  [provider]      Allergies    Patient has no known allergies.    Review of Systems   Review of Systems  Gastrointestinal:  Positive for abdominal pain.    Physical Exam Updated Vital Signs BP 112/70   Pulse 69   Temp 98.2 F (36.8 C)   Resp 15   Ht 5\' 1"  (1.549 m)   Wt 70.3 kg   SpO2 100%   BMI 29.29 kg/m  Physical Exam Vitals and nursing note reviewed.  Constitutional:      General: She is not in acute distress.    Appearance: She is well-developed. She is not diaphoretic.  HENT:     Head: Normocephalic and atraumatic.  Eyes:     Pupils: Pupils are equal, round, and reactive to light.  Cardiovascular:     Rate and Rhythm: Normal rate and regular rhythm.     Heart sounds: No murmur heard.    No friction rub. No gallop.  Pulmonary:  Effort: Pulmonary effort is normal.     Breath sounds: No wheezing or rales.  Abdominal:     General: There is no distension.     Palpations: Abdomen is soft.     Tenderness: There is abdominal tenderness.     Comments: Mild diffuse abdominal tenderness without focality  Musculoskeletal:        General: No tenderness.     Cervical back: Normal range of motion and neck supple.  Skin:    General: Skin is warm and dry.  Neurological:     Mental Status: She is alert and oriented to person, place, and time.  Psychiatric:        Behavior: Behavior normal.     ED Results / Procedures / Treatments   Labs (all labs ordered are listed, but only abnormal results are displayed) Labs Reviewed  COMPREHENSIVE METABOLIC PANEL - Abnormal; Notable for  the following components:      Result Value   AST 12 (*)    Alkaline Phosphatase 37 (*)    All other components within normal limits  CBC - Abnormal; Notable for the following components:   RBC 3.77 (*)    Hemoglobin 11.3 (*)    HCT 33.4 (*)    All other components within normal limits  URINALYSIS, ROUTINE W REFLEX MICROSCOPIC - Abnormal; Notable for the following components:   APPearance CLOUDY (*)    Hgb urine dipstick LARGE (*)    Ketones, ur TRACE (*)    Protein, ur 100 (*)    Leukocytes,Ua LARGE (*)    RBC / HPF >50 (*)    WBC, UA >50 (*)    Bacteria, UA FEW (*)    All other components within normal limits  LIPASE, BLOOD  PREGNANCY, URINE    EKG None  Radiology CT ABDOMEN PELVIS W CONTRAST  Result Date: 01/03/2022 CLINICAL DATA:  Abdominal pain. EXAM: CT ABDOMEN AND PELVIS WITH CONTRAST TECHNIQUE: Multidetector CT imaging of the abdomen and pelvis was performed using the standard protocol following bolus administration of intravenous contrast. RADIATION DOSE REDUCTION: This exam was performed according to the departmental dose-optimization program which includes automated exposure control, adjustment of the mA and/or kV according to patient size and/or use of iterative reconstruction technique. CONTRAST:  69mL OMNIPAQUE IOHEXOL 300 MG/ML  SOLN COMPARISON:  CT abdomen and pelvis 01/03/2021 FINDINGS: Lower chest: No acute abnormality. Hepatobiliary: There is fatty infiltration along the falciform ligament, unchanged. Otherwise, the liver, gallbladder and bile ducts are within normal limits. Pancreas: Unremarkable. No pancreatic ductal dilatation or surrounding inflammatory changes. Spleen: Normal in size without focal abnormality. Adrenals/Urinary Tract: There is diffuse bladder wall thickening. Kidneys and adrenal glands are within normal limits. Stomach/Bowel: Stomach is within normal limits. Appendix appears normal. No evidence of bowel wall thickening, distention, or inflammatory  changes. Vascular/Lymphatic: No significant vascular findings are present. No enlarged abdominal or pelvic lymph nodes. Reproductive: There is a 2.3 x 1.1 cm hypodense area in the right ovary which was not seen on the prior study. The left ovary and uterus are within normal limits. Other: There is a small amount of free fluid in the pelvis which is new from prior. There is no focal abdominal wall hernia. Musculoskeletal: No acute or significant osseous findings. IMPRESSION: 1. 2.3 cm right ovarian simple-appearing cyst. No follow-up imaging is recommended. Reference: JACR 2020 Feb;17(2):248-254 2. New small amount of free fluid in the pelvis may be physiologic. 3. Diffuse bladder wall thickening concerning for cystitis. Electronically Signed  By: Darliss Cheney M.D.   On: 01/03/2022 23:34    Procedures Procedures    Medications Ordered in ED Medications  iohexol (OMNIPAQUE) 300 MG/ML solution 100 mL (75 mLs Intravenous Contrast Given 01/03/22 2312)  cephALEXin (KEFLEX) capsule 1,000 mg (1,000 mg Oral Given 01/04/22 0111)    ED Course/ Medical Decision Making/ A&P                           Medical Decision Making Amount and/or Complexity of Data Reviewed Labs: ordered.  Risk Prescription drug management.   21 yo F with a significant past medical history of IBS comes in with a chief complaints of decreased stools and diffuse colicky abdominal discomfort.  Her urine is not completely clean with too numerous to count whites and some blood.  Few bacteria were noted.  CT scan of the abdomen pelvis with some irregularity of the bladder wall concerning for cystitis.  We will treat for possible cystitis.  Symptoms otherwise sound more consistent with an IBS flare.  We will have her do a trial of MiraLAX.  PCP follow-up.  2:24 AM:  I have discussed the diagnosis/risks/treatment options with the patient and family.  Evaluation and diagnostic testing in the emergency department does not suggest an  emergent condition requiring admission or immediate intervention beyond what has been performed at this time.  They will follow up with  PCP. We also discussed returning to the ED immediately if new or worsening sx occur. We discussed the sx which are most concerning (e.g., sudden worsening pain, fever, inability to tolerate by mouth) that necessitate immediate return. Medications administered to the patient during their visit and any new prescriptions provided to the patient are listed below.  Medications given during this visit Medications  iohexol (OMNIPAQUE) 300 MG/ML solution 100 mL (75 mLs Intravenous Contrast Given 01/03/22 2312)  cephALEXin (KEFLEX) capsule 1,000 mg (1,000 mg Oral Given 01/04/22 0111)     The patient appears reasonably screen and/or stabilized for discharge and I doubt any other medical condition or other [facility] requiring further screening, evaluation, or treatment in the ED at this time prior to discharge.          Final Clinical Impression(s) / ED Diagnoses Final diagnoses:  Generalized abdominal pain  Acute cystitis with hematuria    Rx / DC Orders ED Discharge Orders          Ordered    cephALEXin (KEFLEX) 500 MG capsule        01/04/22 0049    ondansetron (ZOFRAN-ODT) 4 MG disintegrating tablet        01/04/22 0049              Melene Plan, DO 01/04/22 9528

## 2022-01-04 NOTE — Discharge Instructions (Signed)
Please call whoever takes care of you for your irritable bowel syndrome and see when they can see you in the office.  Take 8 scoops of miralax in 32oz of whatever you would like to drink.(Gatorade comes in this size) You can also use a fleets enema which you can buy over the counter at the pharmacy.  Return for worsening abdominal pain, vomiting or fever.

## 2022-01-04 NOTE — ED Notes (Signed)
Patient verbalizes understanding of discharge instructions. Opportunity for questioning and answers were provided. Armband removed by staff, pt discharged from ED. Ambulated out to lobby with sister

## 2022-04-28 ENCOUNTER — Encounter (HOSPITAL_BASED_OUTPATIENT_CLINIC_OR_DEPARTMENT_OTHER): Payer: Self-pay

## 2022-04-28 ENCOUNTER — Other Ambulatory Visit: Payer: Self-pay

## 2022-04-28 ENCOUNTER — Emergency Department (HOSPITAL_BASED_OUTPATIENT_CLINIC_OR_DEPARTMENT_OTHER)
Admission: EM | Admit: 2022-04-28 | Discharge: 2022-04-28 | Disposition: A | Discharge status: Home / Self Care | Payer: Medicaid Other | Attending: Emergency Medicine | Admitting: Emergency Medicine

## 2022-04-28 DIAGNOSIS — Z202 Contact with and (suspected) exposure to infections with a predominantly sexual mode of transmission: Secondary | ICD-10-CM

## 2022-04-28 DIAGNOSIS — Z79899 Other long term (current) drug therapy: Secondary | ICD-10-CM | POA: Insufficient documentation

## 2022-04-28 DIAGNOSIS — J45909 Unspecified asthma, uncomplicated: Secondary | ICD-10-CM | POA: Diagnosis not present

## 2022-04-28 DIAGNOSIS — Z113 Encounter for screening for infections with a predominantly sexual mode of transmission: Secondary | ICD-10-CM | POA: Insufficient documentation

## 2022-04-28 LAB — URINALYSIS, ROUTINE W REFLEX MICROSCOPIC
Bilirubin Urine: NEGATIVE
Glucose, UA: NEGATIVE mg/dL
Hgb urine dipstick: NEGATIVE
Ketones, ur: 15 mg/dL — AB
Leukocytes,Ua: NEGATIVE
Nitrite: NEGATIVE
Protein, ur: >300 mg/dL — AB
Specific Gravity, Urine: 1.03 (ref 1.005–1.030)
pH: 7 (ref 5.0–8.0)

## 2022-04-28 LAB — WET PREP, GENITAL
Clue Cells Wet Prep HPF POC: NONE SEEN
Sperm: NONE SEEN
Trich, Wet Prep: NONE SEEN
WBC, Wet Prep HPF POC: <10 (ref <10)
Yeast Wet Prep HPF POC: NONE SEEN

## 2022-04-28 LAB — PREGNANCY, URINE: Preg Test, Ur: NEGATIVE

## 2022-04-28 MED ORDER — DOXYCYCLINE HYCLATE 100 MG PO TABS: 100.0000 mg | ORAL_TABLET | Freq: Two times a day (BID) | ORAL | 0 refills | Status: DC

## 2022-04-28 MED ORDER — CEFTRIAXONE SODIUM 500 MG IJ SOLR
500.0000 mg | Freq: Once | INTRAMUSCULAR | Status: AC
  Administered 2022-04-28: 500 mg via INTRAMUSCULAR
  Filled 2022-04-28: qty 500

## 2022-04-28 MED ORDER — LIDOCAINE HCL (PF) 1 % IJ SOLN
1.0000 mL | Freq: Once | INTRAMUSCULAR | Status: AC
  Administered 2022-04-28: 1 mL
  Filled 2022-04-28: qty 5

## 2022-04-28 NOTE — ED Triage Notes (Signed)
Patient here POV from Home  Endorses Possible Exposure to Possible STD during Sexual Encounter. No Symptoms.  NAD Noted during Triage. A&Ox4. GCS 15. Ambulatory.

## 2022-04-28 NOTE — ED Provider Notes (Signed)
[facility] DEPT Provider Note   CSN: [id number] Arrival date & time: 04/28/22  1829     History  Chief Complaint  Patient presents with   SEXUALLY TRANSMITTED DISEASE    Patricia Davenport is a 21 y.o. female.  Patient as above with significant medical history as below, including anxiety, eczema, GERD, anal fissure who presents to the ED with complaint of STD check.  Patient reports recent sexual activity with partner who told her that he "caught something."  Unsure what it was but believes that it was an STD.  History of prior STD multiple years ago and completed treatment.  She denies any complaints today, no abdominal pain, nausea or vomiting.  No vaginal discharge or pelvic pain.  She recently completed her menstrual cycle.  Was mildly heavier than normal but otherwise typical.  No rashes. No diarrhea or abdominal pain..      Past Medical History:  Diagnosis Date   Anal fissure    Anxiety    Asthma    childhood   Eczema    GERD (gastroesophageal reflux disease)    History of anxiety    IBS (irritable bowel syndrome)     Past Surgical History:  Procedure Laterality Date   ABCESS DRAINAGE     ADENOIDECTOMY     TONSILLECTOMY     WISDOM TOOTH EXTRACTION       The history is provided by the patient and a friend. No language interpreter was used.       Home Medications Prior to Admission medications   Medication Sig Start Date End Date Taking? Authorizing Provider  doxycycline (VIBRA-TABS) 100 MG tablet Take 1 tablet (100 mg total) by mouth 2 (two) times daily. 04/28/22  Yes Jeanell Sparrow, DO  cephALEXin (KEFLEX) 500 MG capsule 2 caps po bid x 7 days 01/04/22   Deno Etienne, DO  escitalopram (LEXAPRO) 20 MG tablet Take 20 mg by mouth daily.    [provider]  etonogestrel (NEXPLANON) 68 MG IMPL implant 1 each by Subdermal route once.    [provider]  fluconazole (DIFLUCAN) 150 MG tablet Take 1 tablet (150 mg total) by mouth once  a week. 09/24/20   Jaynee Eagles, PA-C  hydrOXYzine (ATARAX/VISTARIL) 25 MG tablet Take 0.5-1 tablets (12.5-25 mg total) by mouth every 8 (eight) hours as needed for itching. 09/24/20   Jaynee Eagles, PA-C  omeprazole (PRILOSEC) 20 MG capsule Take 1 capsule (20 mg total) by mouth daily. 01/03/21   Charlesetta Shanks, MD  ondansetron (ZOFRAN-ODT) 4 MG disintegrating tablet 4mg  ODT q4 hours prn nausea/vomit 01/04/22   Deno Etienne, DO  pantoprazole (PROTONIX) 20 MG tablet Take 1 tablet (20 mg total) by mouth daily. 02/26/20   Hall-Potvin, [place], PA-C  rizatriptan (MAXALT-MLT) 5 MG disintegrating tablet PLEASE SEE ATTACHED FOR DETAILED DIRECTIONS 08/05/18   [provider]  sertraline (ZOLOFT) 100 MG tablet Take 100 mg by mouth daily.    [provider]  VALACYCLOVIR HCL PO Take by mouth.    [provider]  norgestimate-ethinyl estradiol (ORTHO-CYCLEN) 0.25-35 MG-MCG tablet Take 1 tablet by mouth daily. 01/13/19 02/26/20  Constant, Peggy, MD  propranolol ER (INDERAL LA) 60 MG 24 hr capsule TAKE 1 CAPSULE BY MOUTH EVERY MORNING FOR PREVENTION OF MIGRAINE SYMPTOMS 06/26/18 02/26/20  [provider]      Allergies    Patient has no known allergies.    Review of Systems   Review of Systems  Constitutional:  Negative for activity change  and fever.  HENT:  Negative for facial swelling and trouble swallowing.   Eyes:  Negative for discharge and redness.  Respiratory:  Negative for cough and shortness of breath.   Cardiovascular:  Negative for chest pain and palpitations.  Gastrointestinal:  Negative for abdominal pain and nausea.  Genitourinary:  Negative for dysuria and flank pain.  Musculoskeletal:  Negative for back pain and gait problem.  Skin:  Negative for pallor and rash.  Neurological:  Negative for syncope and headaches.    Physical Exam Updated Vital Signs BP 125/71 (BP Location: Right Arm)   Pulse 78   Temp 98.4 F (36.9 C) (Oral)   Resp 18   Ht 5\' 1"  (1.549 m)    Wt 68 kg   SpO2 100%   BMI 28.34 kg/m  Physical Exam Vitals and nursing note reviewed.  Constitutional:      General: She is not in acute distress.    Appearance: Normal appearance.  HENT:     Head: Normocephalic and atraumatic.     Right Ear: External ear normal.     Left Ear: External ear normal.     Nose: Nose normal.     Mouth/Throat:     Mouth: Mucous membranes are moist.  Eyes:     General: No scleral icterus.       Right eye: No discharge.        Left eye: No discharge.  Cardiovascular:     Rate and Rhythm: Normal rate.  Pulmonary:     Effort: Pulmonary effort is normal. No respiratory distress.     Breath sounds: No stridor.  Abdominal:     General: Abdomen is flat. There is no distension.     Tenderness: There is no guarding.  Musculoskeletal:        General: Normal range of motion.     Cervical back: Normal range of motion.     Right lower leg: No edema.     Left lower leg: No edema.  Skin:    General: Skin is warm and dry.     Capillary Refill: Capillary refill takes less than 2 seconds.  Neurological:     Mental Status: She is alert and oriented to person, place, and time.     GCS: GCS eye subscore is 4. GCS verbal subscore is 5. GCS motor subscore is 6.  Psychiatric:        Mood and Affect: Mood normal.        Behavior: Behavior normal.     ED Results / Procedures / Treatments   Labs (all labs ordered are listed, but only abnormal results are displayed) Labs Reviewed  URINALYSIS, ROUTINE W REFLEX MICROSCOPIC - Abnormal; Notable for the following components:      Result Value   APPearance HAZY (*)    Ketones, ur 15 (*)    Protein, ur >300 (*)    All other components within normal limits  WET PREP, GENITAL  PREGNANCY, URINE  GC/CHLAMYDIA PROBE AMP ([place]) NOT AT [facility]    EKG None  Radiology No results found.  Procedures Procedures    Medications Ordered in ED Medications  cefTRIAXone (ROCEPHIN) injection 500 mg (500 mg  Intramuscular Given 04/28/22 2044)  lidocaine (PF) (XYLOCAINE) 1 % injection 1-2.1 mL (1 mL Other Given 04/28/22 2044)    ED Course/ Medical Decision Making/ A&P  Medical Decision Making Amount and/or Complexity of Data Reviewed Labs: ordered.  Risk Prescription drug management.   This patient presents to the ED with chief complaint(s) of STI check with pertinent past medical history of above including prior STI which further complicates the presenting complaint. The complaint involves an extensive differential diagnosis and also carries with it a high risk of complications and morbidity.    The differential diagnosis includes but not limited to STI, UTI, pregnancy, etc. Serious etiologies were considered.   The initial plan is to screening labs, STI check   Additional history obtained: Additional history obtained from  friend Records reviewed  prior ED visits, prior labs and imaging  Independent labs interpretation:  The following labs were independently interpreted: Urinalysis without acute infection.  Pregnancy negative.  Wet prep negative.  Independent visualization of imaging: na  Treatment and Reassessment: Empiric sti treatment, rocephin, doxy rx >> unchanged   Consultation: - Consulted or discussed management/test interpretation w/ external professional: na  Consideration for admission or further workup: Admission was considered   21 year-old female here to the ED for STI check.  She is currently asymptomatic.  Concerned that she exposed to partner 2 weeks ago that found out he was positive for STI.  She again has no symptoms.  No Rashes.  Offered HIV testing to which patient wants to defer at this time.  We will send GC chlamydia, wet prep.  Patient is opted for empiric treatment.  Will give Rocephin in the ED, prescription for doxycycline for home.  Advised her to complete antibiotics prior to resuming sexual activity.  Follow-up with her  PCP. Follow up mychart for sti results.   The patient improved significantly and was discharged in stable condition. Detailed discussions were had with the patient regarding current findings, and need for close f/u with PCP or on call doctor. The patient has been instructed to return immediately if the symptoms worsen in any way for re-evaluation. Patient verbalized understanding and is in agreement with current care plan. All questions answered prior to discharge.    Social Determinants of health: Social History   Tobacco Use   Smoking status: Never   Smokeless tobacco: Never  Vaping Use   Vaping Use: Never used  Substance Use Topics   Alcohol use: Yes    Comment: Seldom   Drug use: No            Final Clinical Impression(s) / ED Diagnoses Final diagnoses:  Encounter for assessment of sexually transmitted disease exposure    Rx / DC Orders ED Discharge Orders          Ordered    doxycycline (VIBRA-TABS) 100 MG tablet  2 times daily        04/28/22 2024              Sloan Leiter, DO 04/28/22 2152

## 2022-04-28 NOTE — ED Notes (Signed)
All appropriate discharge materials reviewed at length with patient. Time for questions provided. Pt has no other questions at this time and verbalizes understanding of all provided materials.  

## 2022-04-28 NOTE — Discharge Instructions (Addendum)
Please complete your antibiotics prior to resuming sexual activity.  Your results will be available on MyChart  It was a pleasure caring for you today in the emergency department.  Please return to the emergency department for any worsening or worrisome symptoms.

## 2022-05-01 LAB — GC/CHLAMYDIA PROBE AMP (~~LOC~~) NOT AT ARMC
Chlamydia: NEGATIVE
Comment: NEGATIVE
Comment: NORMAL
Neisseria Gonorrhea: NEGATIVE

## 2022-08-05 ENCOUNTER — Encounter (HOSPITAL_BASED_OUTPATIENT_CLINIC_OR_DEPARTMENT_OTHER): Payer: Self-pay | Admitting: Emergency Medicine

## 2022-08-05 ENCOUNTER — Emergency Department (HOSPITAL_BASED_OUTPATIENT_CLINIC_OR_DEPARTMENT_OTHER)
Admission: EM | Admit: 2022-08-05 | Discharge: 2022-08-05 | Disposition: A | Discharge status: Home / Self Care | Payer: Medicaid Other | Attending: Emergency Medicine | Admitting: Emergency Medicine

## 2022-08-05 ENCOUNTER — Other Ambulatory Visit: Payer: Self-pay

## 2022-08-05 DIAGNOSIS — J069 Acute upper respiratory infection, unspecified: Secondary | ICD-10-CM | POA: Insufficient documentation

## 2022-08-05 DIAGNOSIS — Z1152 Encounter for screening for COVID-19: Secondary | ICD-10-CM | POA: Diagnosis not present

## 2022-08-05 DIAGNOSIS — R0981 Nasal congestion: Secondary | ICD-10-CM | POA: Diagnosis present

## 2022-08-05 LAB — RESP PANEL BY RT-PCR (RSV, FLU A&B, COVID)  RVPGX2
Influenza A by PCR: NEGATIVE
Influenza B by PCR: NEGATIVE
Resp Syncytial Virus by PCR: NEGATIVE
SARS Coronavirus 2 by RT PCR: NEGATIVE

## 2022-08-05 MED ORDER — ACETAMINOPHEN 325 MG PO TABS
650.0000 mg | ORAL_TABLET | Freq: Once | ORAL | Status: AC
  Administered 2022-08-05: 650 mg via ORAL
  Filled 2022-08-05: qty 2

## 2022-08-05 NOTE — ED Provider Notes (Signed)
[facility] EMERGENCY DEPARTMENT Provider Note   CSN: [id number] Arrival date & time: 08/05/22  1156     History  Chief Complaint  Patient presents with   Generalized Body Aches   Headache    Patricia Davenport is a 22 y.o. female.   Headache    Patient with medical history of anxiety, asthma, GERD presents to the emergency department due to generalized bodyaches, fever, chills, nasal congestion and diarrhea.  Is been going on for the last 2 to 3 days.  Most of her family is sick at home with the flu.  Denies any chest pain or feeling short of breath.  No meds prior to arrival.  Home Medications Prior to Admission medications   Medication Sig Start Date End Date Taking? Authorizing Provider  cephALEXin (KEFLEX) 500 MG capsule 2 caps po bid x 7 days 01/04/22   Deno Etienne, DO  doxycycline (VIBRA-TABS) 100 MG tablet Take 1 tablet (100 mg total) by mouth 2 (two) times daily. 04/28/22   Jeanell Sparrow, DO  escitalopram (LEXAPRO) 20 MG tablet Take 20 mg by mouth daily.    [provider]  etonogestrel (NEXPLANON) 68 MG IMPL implant 1 each by Subdermal route once.    [provider]  fluconazole (DIFLUCAN) 150 MG tablet Take 1 tablet (150 mg total) by mouth once a week. 09/24/20   Jaynee Eagles, PA-C  hydrOXYzine (ATARAX/VISTARIL) 25 MG tablet Take 0.5-1 tablets (12.5-25 mg total) by mouth every 8 (eight) hours as needed for itching. 09/24/20   Jaynee Eagles, PA-C  omeprazole (PRILOSEC) 20 MG capsule Take 1 capsule (20 mg total) by mouth daily. 01/03/21   Charlesetta Shanks, MD  ondansetron (ZOFRAN-ODT) 4 MG disintegrating tablet 4mg  ODT q4 hours prn nausea/vomit 01/04/22   Deno Etienne, DO  pantoprazole (PROTONIX) 20 MG tablet Take 1 tablet (20 mg total) by mouth daily. 02/26/20   Hall-Potvin, [place], PA-C  rizatriptan (MAXALT-MLT) 5 MG disintegrating tablet PLEASE SEE ATTACHED FOR DETAILED DIRECTIONS 08/05/18   [provider]  sertraline (ZOLOFT) 100 MG tablet Take 100  mg by mouth daily.    [provider]  VALACYCLOVIR HCL PO Take by mouth.    [provider]  norgestimate-ethinyl estradiol (ORTHO-CYCLEN) 0.25-35 MG-MCG tablet Take 1 tablet by mouth daily. 01/13/19 02/26/20  Constant, Peggy, MD  propranolol ER (INDERAL LA) 60 MG 24 hr capsule TAKE 1 CAPSULE BY MOUTH EVERY MORNING FOR PREVENTION OF MIGRAINE SYMPTOMS 06/26/18 02/26/20  [provider]      Allergies    Patient has no known allergies.    Review of Systems   Review of Systems  Neurological:  Positive for headaches.    Physical Exam Updated Vital Signs BP (!) 109/55 (BP Location: Right Arm)   Pulse 83   Temp 98.2 F (36.8 C) (Oral)   Resp 16   SpO2 100%  Physical Exam Vitals and nursing note reviewed. Exam conducted with a chaperone present.  Constitutional:      Appearance: Normal appearance.  HENT:     Head: Normocephalic and atraumatic.     Nose: Congestion present.     Mouth/Throat:     Pharynx: No posterior oropharyngeal erythema.  Eyes:     General: No scleral icterus.       Right eye: No discharge.        Left eye: No discharge.     Extraocular Movements: Extraocular movements intact.     Pupils: Pupils are equal, round, and reactive to light.  Cardiovascular:     Rate and Rhythm: Normal rate and regular rhythm.     Pulses: Normal pulses.     Heart sounds: Normal heart sounds. No murmur heard.    No friction rub. No gallop.  Pulmonary:     Effort: Pulmonary effort is normal. No respiratory distress.     Breath sounds: Normal breath sounds.     Comments: Lungs are clear to auscultation bilaterally Abdominal:     General: Abdomen is flat. Bowel sounds are normal. There is no distension.     Palpations: Abdomen is soft.     Tenderness: There is no abdominal tenderness.  Skin:    General: Skin is warm and dry.     Coloration: Skin is not jaundiced.  Neurological:     Mental Status: She is alert. Mental status is at baseline.      Coordination: Coordination normal.     ED Results / Procedures / Treatments   Labs (all labs ordered are listed, but only abnormal results are displayed) Labs Reviewed  RESP PANEL BY RT-PCR (RSV, FLU A&B, COVID)  [id number]    EKG None  Radiology No results found.  Procedures Procedures    Medications Ordered in ED Medications  acetaminophen (TYLENOL) tablet 650 mg (650 mg Oral Given 08/05/22 1224)    ED Course/ Medical Decision Making/ A&P                             Medical Decision Making Risk OTC drugs.   Patient presents to the emergency department due to generalized bodyaches and viral constellation of symptoms.  Given she has been around family members test positive for the flu I suspect she has it as well.  She is not hypoxic, not having chest pains and systemically is very well-appearing.  I ordered a viral panel and will reassess.  Also ordered Tylenol for body aches.  -BP (!) 109/55 (BP Location: Right Arm)   Pulse 83   Temp 98.2 F (36.8 C) (Oral)   Resp 16   SpO2 100%   Viral panel is negative for COVID and flu.  I still suspect patient has a respiratory illness, return precaution discussed with patient who verbalized understanding agreement.  Supportive care was advised.  Stable for discharge at this time.        Final Clinical Impression(s) / ED Diagnoses Final diagnoses:  Upper respiratory tract infection, unspecified type    Rx / DC Orders ED Discharge Orders     None         Sherrill Raring, PA-C 08/05/22 Miranda, [place], DO 08/05/22 1311

## 2022-08-05 NOTE — Discharge Instructions (Addendum)
You may take Tylenol and Motrin for body aches.  Drink plenty of fluids.  This is likely a virus that just needs to run its course, if you are unable to eat or drink without vomiting, you have severe abdominal pain, you have new or concerning symptoms return to the ED.  Otherwise follow-up with your primary doctor to continue having symptoms the next week.

## 2022-08-05 NOTE — ED Triage Notes (Signed)
Pt reports generalized body aches, headache and diarrhea since Thursday. Family members have flu

## 2022-12-23 IMAGING — CT CT ABD-PELV W/O CM
2 of 4 series · 17 of 46 positions shown, 19 images · non-contrast
Comparison: CT abdomen pelvis dated 02/18/2014.

CLINICAL DATA: 19-year-old female with right lower quadrant
abdominal pain.

EXAM:
CT ABDOMEN AND PELVIS WITHOUT CONTRAST
TECHNIQUE: Multidetector CT imaging of the abdomen and pelvis was performed
following the standard protocol without IV contrast.

[Series 2: abd pel wo · axial · 0.73mm/px · z∈[-970,-590]mm · 14 of 84 slices shown, 16 images]
[im 4/84  soft-tissue]
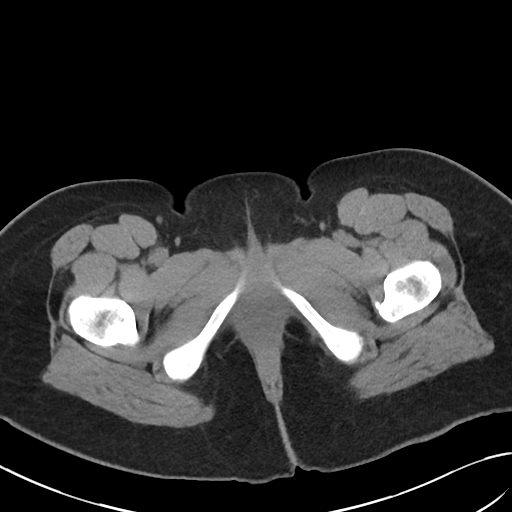
[im 4/84  bone]
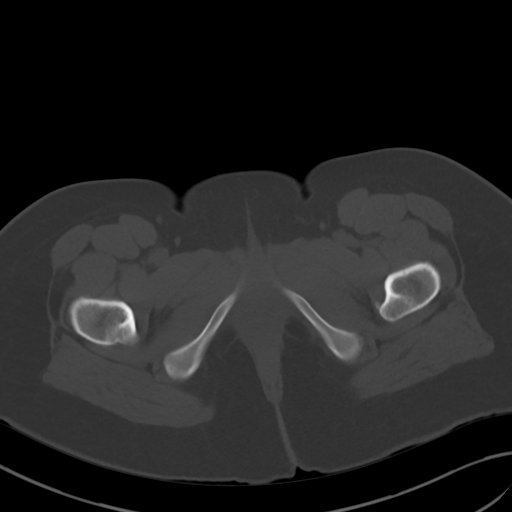
[im 11/84  soft-tissue]
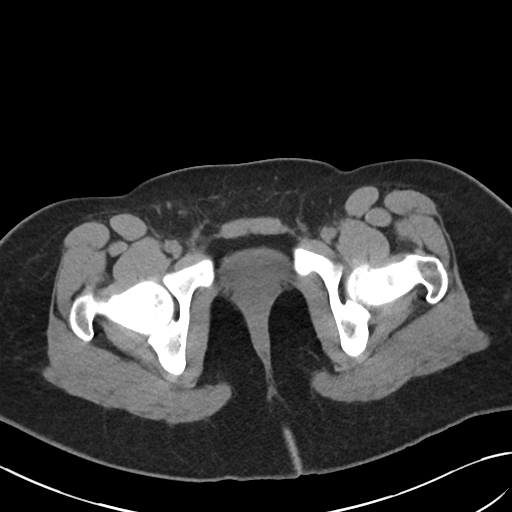
[im 18/84  soft-tissue]
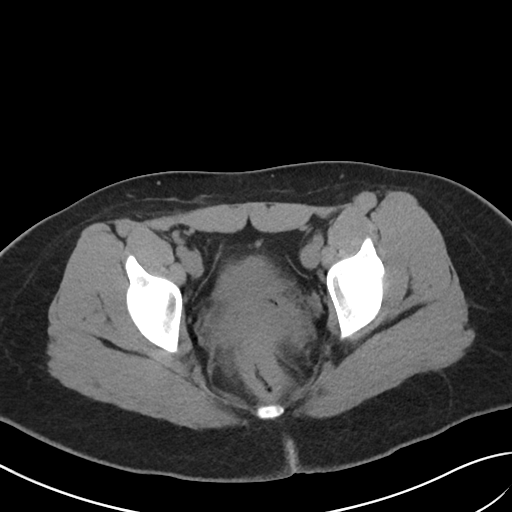
[im 21/84  soft-tissue]
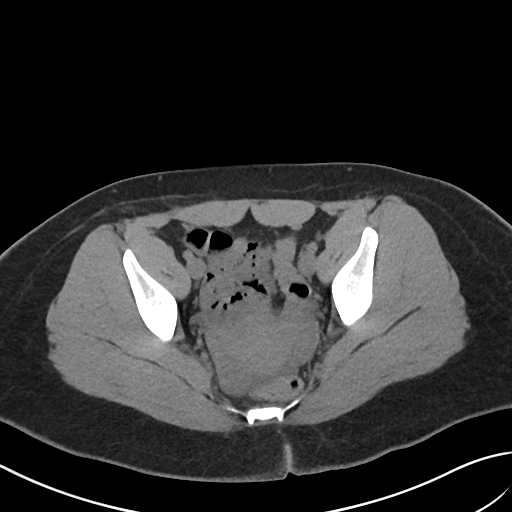
[im 28/84  soft-tissue]
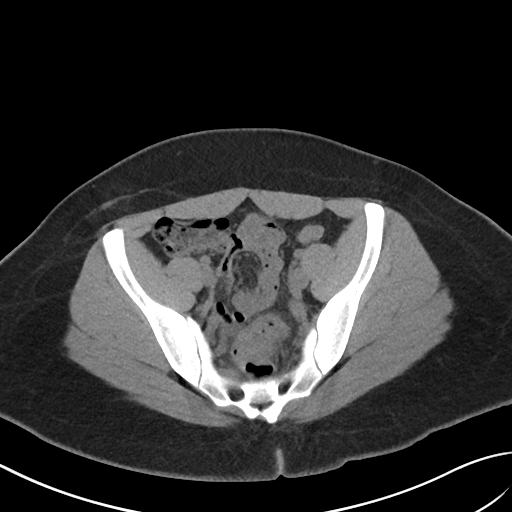
[im 35/84  soft-tissue]
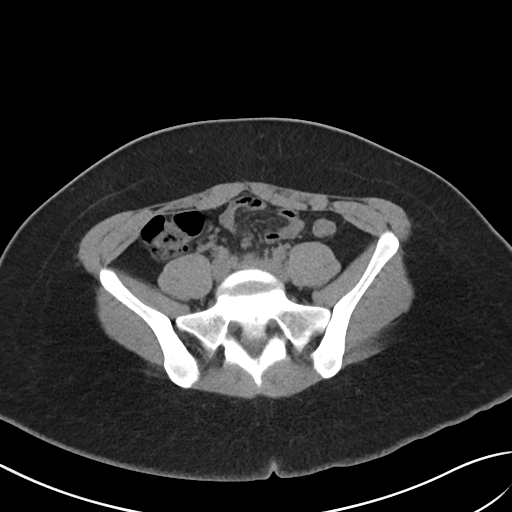
[im 39/84  soft-tissue]
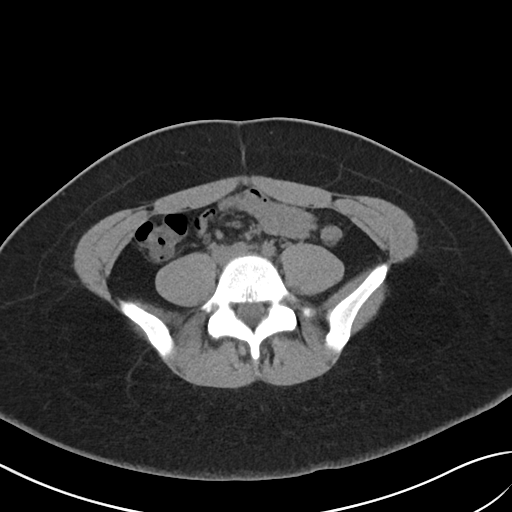
[im 45/84  soft-tissue]
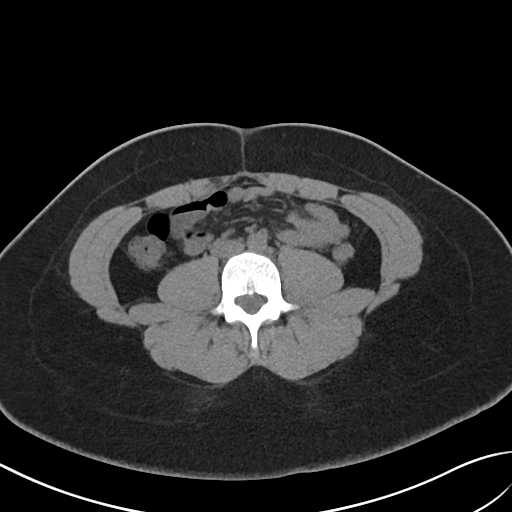
[im 49/84  soft-tissue]
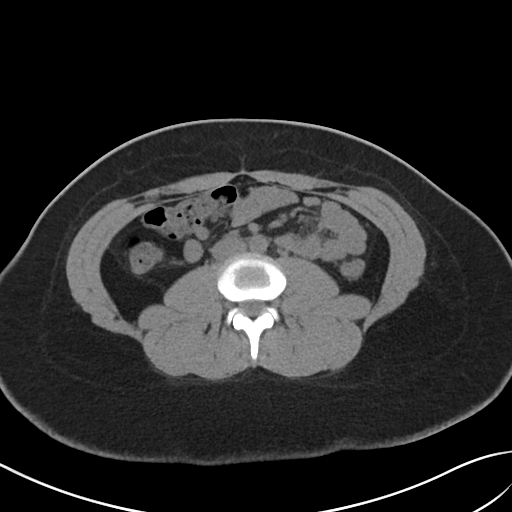
[im 49/84  bone]
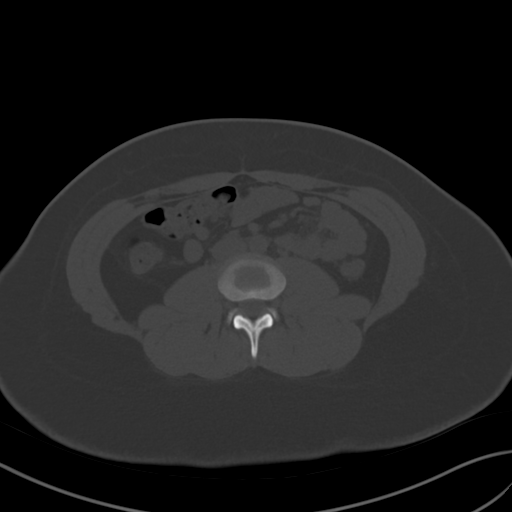
[im 56/84  soft-tissue]
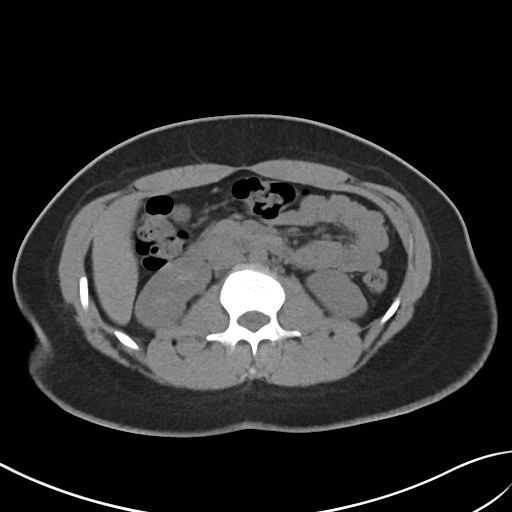
[im 63/84  soft-tissue]
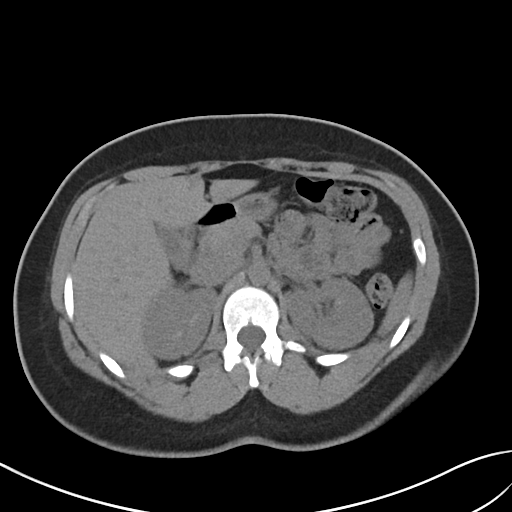
[im 66/84  soft-tissue]
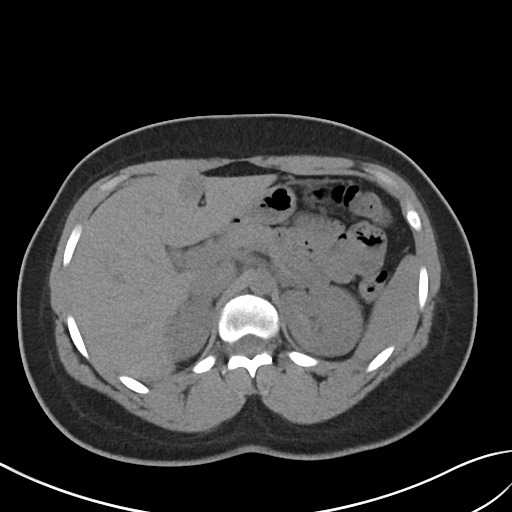
[im 73/84  soft-tissue]
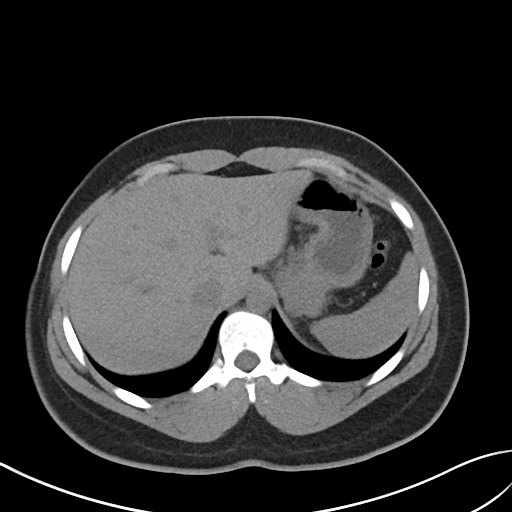
[im 80/84  soft-tissue]
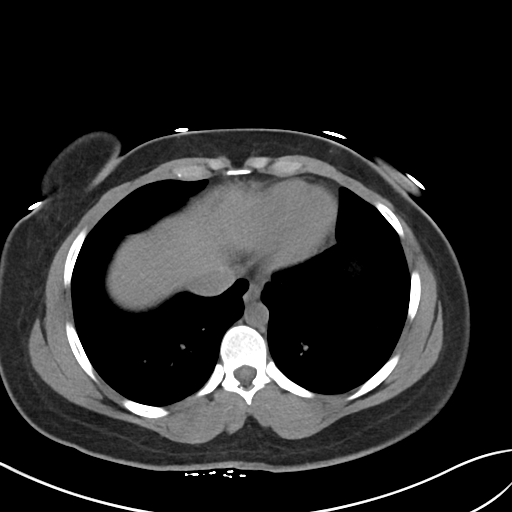

[Series 5: coronal · coronal · 0.78mm/px · 3 of 83 slices shown]
[im 28/83  soft-tissue]
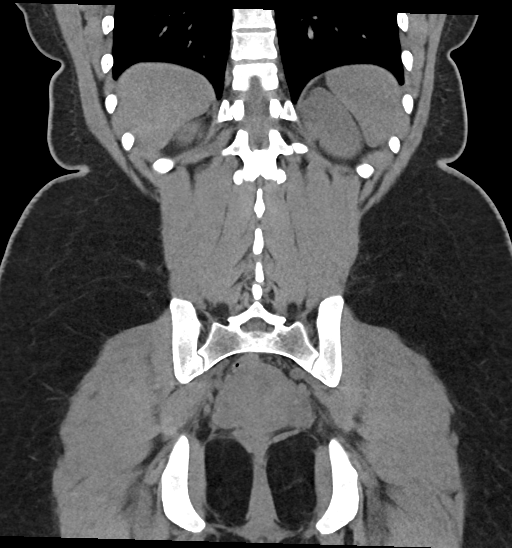
[im 37/83  soft-tissue]
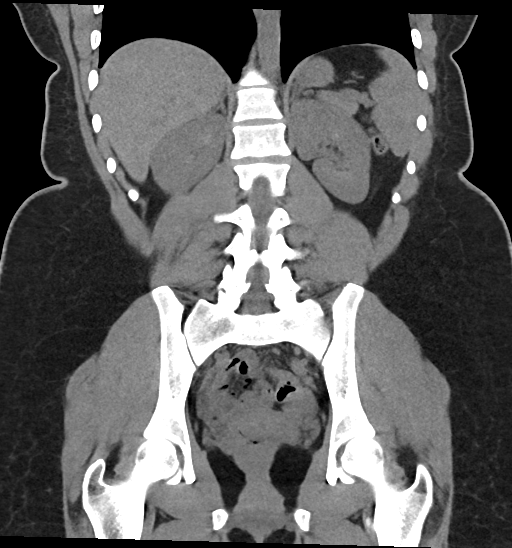
[im 46/83  soft-tissue]
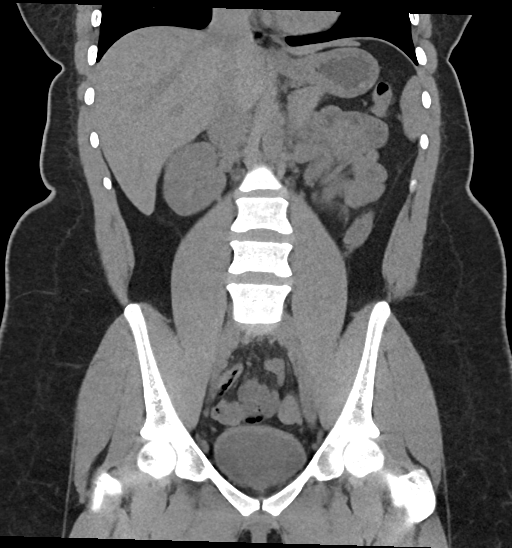

[17 of 46 positions shown; findings below may reference images not displayed]

FINDINGS: Evaluation of this exam is limited in the absence of intravenous
contrast.

Lower chest: The visualized lung bases are clear.

No intra-abdominal free air. Small free fluid within the pelvis.

Hepatobiliary: Focal hypodense area adjacent to the falciform
ligament, likely fatty infiltration. The liver is otherwise
unremarkable. No intrahepatic biliary dilatation. The gallbladder is
unremarkable.

Pancreas: Unremarkable. No pancreatic ductal dilatation or
surrounding inflammatory changes.

Spleen: Normal in size without focal abnormality.

Adrenals/Urinary Tract: The adrenal glands unremarkable. The
kidneys, visualized ureters, and urinary bladder appear
unremarkable.

Stomach/Bowel: There is no bowel obstruction or active inflammation.
The appendix is normal.

Vascular/Lymphatic: The abdominal aorta and IVC are grossly
unremarkable on this noncontrast CT. No portal venous gas. There is
no adenopathy.

Reproductive: The uterus is retroverted and anteflexed and grossly
unremarkable. No adnexal masses.

Other: None

Musculoskeletal: No acute or significant osseous findings.
IMPRESSION: No acute intra-abdominal or pelvic pathology. No bowel obstruction.
Normal appendix.

## 2023-06-25 ENCOUNTER — Other Ambulatory Visit: Payer: Self-pay

## 2023-06-25 ENCOUNTER — Emergency Department
Admission: EM | Admit: 2023-06-25 | Discharge: 2023-06-25 | Disposition: A | Discharge status: Home / Self Care | Payer: Medicaid Other | Attending: Emergency Medicine | Admitting: Emergency Medicine

## 2023-06-25 ENCOUNTER — Emergency Department: Payer: Medicaid Other

## 2023-06-25 DIAGNOSIS — R079 Chest pain, unspecified: Secondary | ICD-10-CM | POA: Insufficient documentation

## 2023-06-25 DIAGNOSIS — J45909 Unspecified asthma, uncomplicated: Secondary | ICD-10-CM | POA: Insufficient documentation

## 2023-06-25 LAB — BASIC METABOLIC PANEL
Anion gap: 7 (ref 5–15)
BUN: 12 mg/dL (ref 6–20)
CO2: 25 mmol/L (ref 22–32)
Calcium: 8.9 mg/dL (ref 8.9–10.3)
Chloride: 103 mmol/L (ref 98–111)
Creatinine, Ser: 0.77 mg/dL (ref 0.44–1.00)
GFR, Estimated: >60 mL/min (ref >60)
Glucose, Bld: 94 mg/dL (ref 70–99)
Potassium: 3.8 mmol/L (ref 3.5–5.1)
Sodium: 135 mmol/L (ref 135–145)

## 2023-06-25 LAB — POC URINE PREG, ED: Preg Test, Ur: NEGATIVE

## 2023-06-25 LAB — CBC
HCT: 33.7 % — ABNORMAL LOW (ref 36.0–46.0)
Hemoglobin: 11.3 g/dL — ABNORMAL LOW (ref 12.0–15.0)
MCH: 30.5 pg (ref 26.0–34.0)
MCHC: 33.5 g/dL (ref 30.0–36.0)
MCV: 90.8 fL (ref 80.0–100.0)
Platelets: 298 10*3/uL (ref 150–400)
RBC: 3.71 MIL/uL — ABNORMAL LOW (ref 3.87–5.11)
RDW: 12.5 % (ref 11.5–15.5)
WBC: 6.2 10*3/uL (ref 4.0–10.5)
nRBC: 0 % (ref 0.0–0.2)

## 2023-06-25 LAB — D-DIMER, QUANTITATIVE: D-Dimer, Quant: 0.28 ug{FEU}/mL (ref 0.00–0.50)

## 2023-06-25 LAB — TROPONIN I (HIGH SENSITIVITY): Troponin I (High Sensitivity): <2 ng/L (ref <18)

## 2023-06-25 MED ORDER — LIDOCAINE 5 % EX PTCH
1.0000 | MEDICATED_PATCH | CUTANEOUS | Status: DC
  Administered 2023-06-25: 1 via TRANSDERMAL
  Filled 2023-06-25: qty 1

## 2023-06-25 NOTE — ED Provider Notes (Signed)
[facility] Provider Note    Event Date/Time   First MD Initiated Contact with Patient 06/25/23 6301     (approximate)   History   Chief Complaint multiple complaints   HPI  Patricia Davenport is a 22 y.o. female with past medical history of asthma, anxiety, and GERD who presents to the ED complaining of chest pain.  Patient reports that she has had about 2 weeks of intermittent pain across both sides of her chest.  She describes it as sharp and worse when she takes a deep breath, also associated with some difficulty breathing.  She has not had any fevers or cough and denies any pain or swelling in her legs.  She does report feeling a small lump on the right upper portion of her right breast for the past 2 days.     Physical Exam   Triage Vital Signs: ED Triage Vitals  Encounter Vitals Group     BP 06/25/23 1810 139/86     Systolic BP Percentile --      Diastolic BP Percentile --      Pulse Rate 06/25/23 1810 75     Resp 06/25/23 1810 20     Temp 06/25/23 1810 98.5 F (36.9 C)     Temp src --      SpO2 06/25/23 1810 100 %     Weight 06/25/23 1812 138 lb (62.6 kg)     Height 06/25/23 1812 5\' 1"  (1.549 m)     Head Circumference --      Peak Flow --      Pain Score 06/25/23 1812 5     Pain Loc --      Pain Education --      Exclude from Growth Chart --     Most recent vital signs: Vitals:   06/25/23 1810  BP: 139/86  Pulse: 75  Resp: 20  Temp: 98.5 F (36.9 C)  SpO2: 100%    Constitutional: Alert and oriented. Eyes: Conjunctivae are normal. Head: Atraumatic. Nose: No congestion/rhinnorhea. Mouth/Throat: Mucous membranes are moist.  Cardiovascular: Normal rate, regular rhythm. Grossly normal heart sounds.  2+ radial pulses bilaterally. Respiratory: Normal respiratory effort.  No retractions. Lungs CTAB.  Prominent rib to right upper chest wall, no other lesion noted. Gastrointestinal: Soft and nontender. No distention. Musculoskeletal: No  lower extremity tenderness nor edema.  Neurologic:  Normal speech and language. No gross focal neurologic deficits are appreciated.    ED Results / Procedures / Treatments   Labs (all labs ordered are listed, but only abnormal results are displayed) Labs Reviewed  CBC - Abnormal; Notable for the following components:      Result Value   RBC 3.71 (*)    Hemoglobin 11.3 (*)    HCT 33.7 (*)    All other components within normal limits  BASIC METABOLIC PANEL  D-DIMER, QUANTITATIVE  POC URINE PREG, ED  TROPONIN I (HIGH SENSITIVITY)     EKG  ED ECG REPORT I, Chesley Noon, the attending physician, personally viewed and interpreted this ECG.   Date: 06/25/2023  EKG Time: 18:21  Rate: 81  Rhythm: normal sinus rhythm  Axis: Normal  Intervals:none  ST&T Change: None  RADIOLOGY Chest x-ray reviewed and interpreted by me with no infiltrate, edema, or effusion.  PROCEDURES:  Critical Care performed: No  Procedures   MEDICATIONS ORDERED IN ED: Medications  lidocaine (LIDODERM) 5 % 1 patch (1 patch Transdermal Patch Applied 06/25/23 2159)  IMPRESSION / MDM / ASSESSMENT AND PLAN / ED COURSE  I reviewed the triage vital signs and the nursing notes.                              22 y.o. female with past medical history of asthma, anxiety, and GERD who presents to the ED complaining of sharp and pleuritic chest pain intermittently for the past 2 weeks.  Patient's presentation is most consistent with acute presentation with potential threat to life or bodily function.  Differential diagnosis includes, but is not limited to, ACS, PE, pneumonia, pneumothorax, musculoskeletal pain, GERD, anxiety, abscess, lipoma.  Patient well-appearing and in no acute distress, vital signs are unremarkable.  EKG shows no evidence of arrhythmia or ischemia, labs are reassuring with no significant anemia, leukocytosis, electrolyte abnormality, or AKI.  Troponin within normal limits and I  doubt ACS given her atypical symptoms with no apparent risk factors.  Given pleuritic pain, we will check D-dimer.  Chest x-ray unremarkable by my read.  No palpable lump or mass noted to patient's chest, area that she describes appears to be prominent rib.  D-dimer within normal limits, pain improved following Lidoderm patch.  Patient appropriate for discharge home with PCP follow-up, was counseled to return to the ED for new or worsening symptoms.  Patient agrees with plan.      FINAL CLINICAL IMPRESSION(S) / ED DIAGNOSES   Final diagnoses:  Chest pain, unspecified type     Rx / DC Orders   ED Discharge Orders     None        Note:  This document was prepared using Dragon voice recognition software and may include unintentional dictation errors.   Chesley Noon, MD 06/25/23 2208

## 2023-06-25 NOTE — ED Triage Notes (Signed)
Pt to ED For chest pain for a couple weeks, reports has been seen at PCP for same. Also reports headache, lump to right side of chest, shob.  NAD noted

## 2024-05-15 ENCOUNTER — Other Ambulatory Visit: Payer: Self-pay

## 2024-05-15 ENCOUNTER — Encounter (HOSPITAL_BASED_OUTPATIENT_CLINIC_OR_DEPARTMENT_OTHER): Payer: Self-pay | Admitting: Emergency Medicine

## 2024-05-15 ENCOUNTER — Emergency Department (HOSPITAL_BASED_OUTPATIENT_CLINIC_OR_DEPARTMENT_OTHER)
Admission: EM | Admit: 2024-05-15 | Discharge: 2024-05-15 | Disposition: A | Discharge status: Home / Self Care | Attending: Emergency Medicine | Admitting: Emergency Medicine

## 2024-05-15 ENCOUNTER — Other Ambulatory Visit (HOSPITAL_BASED_OUTPATIENT_CLINIC_OR_DEPARTMENT_OTHER): Payer: Self-pay

## 2024-05-15 DIAGNOSIS — Z3202 Encounter for pregnancy test, result negative: Secondary | ICD-10-CM | POA: Diagnosis present

## 2024-05-15 LAB — CBC WITH DIFFERENTIAL/PLATELET
Abs Immature Granulocytes: 0.01 K/uL (ref 0.00–0.07)
Basophils Absolute: 0 K/uL (ref 0.0–0.1)
Basophils Relative: 1 %
Eosinophils Absolute: 0.3 K/uL (ref 0.0–0.5)
Eosinophils Relative: 6 %
HCT: 34.4 % — ABNORMAL LOW (ref 36.0–46.0)
Hemoglobin: 11.6 g/dL — ABNORMAL LOW (ref 12.0–15.0)
Immature Granulocytes: 0 %
Lymphocytes Relative: 56 %
Lymphs Abs: 2.8 K/uL (ref 0.7–4.0)
MCH: 30.4 pg (ref 26.0–34.0)
MCHC: 33.7 g/dL (ref 30.0–36.0)
MCV: 90.1 fL (ref 80.0–100.0)
Monocytes Absolute: 0.3 K/uL (ref 0.1–1.0)
Monocytes Relative: 6 %
Neutro Abs: 1.5 K/uL — ABNORMAL LOW (ref 1.7–7.7)
Neutrophils Relative %: 31 %
Platelets: 276 K/uL (ref 150–400)
RBC: 3.82 MIL/uL — ABNORMAL LOW (ref 3.87–5.11)
RDW: 12 % (ref 11.5–15.5)
WBC: 4.9 K/uL (ref 4.0–10.5)
nRBC: 0 % (ref 0.0–0.2)

## 2024-05-15 LAB — HCG, QUANTITATIVE, PREGNANCY: hCG, Beta Chain, Quant, S: <1 m[IU]/mL (ref <5)

## 2024-05-15 MED ORDER — ONDANSETRON 4 MG PO TBDP
4.0000 mg | ORAL_TABLET | Freq: Three times a day (TID) | ORAL | 0 refills | Status: AC | PRN
  Filled 2024-05-15: qty 20, 7d supply, fill #0

## 2024-05-15 MED ORDER — ONDANSETRON 4 MG PO TBDP
4.0000 mg | ORAL_TABLET | Freq: Once | ORAL | Status: AC
  Administered 2024-05-15: 4 mg via ORAL
  Filled 2024-05-15: qty 1

## 2024-05-15 NOTE — Discharge Instructions (Signed)
 It was a pleasure take care of you here today  As we discussed your pregnancy test here was negative.  Is possible he could have miscarried over the weekend.  I have written you for some Zofran  which is a nausea medicine as needed.  Make sure to follow-up with your OB/GYN, return for any worsening symptoms

## 2024-05-15 NOTE — ED Provider Notes (Signed)
 [facility] AT [facility] Provider Note   CSN: [id number] Arrival date & time: 05/15/24  1402     Patient presents with: Vaginal Bleeding   Patricia Davenport is a 22 y.o. female here for evaluation of vaginal bleeding.  Patient states she think she is approximately [redacted] weeks pregnant.  Had a negative urine pregnancy test at [facility] however then had blood work which showed a mildly positive result.  She had subsequent blood work performed which showed a declining hCG.  States on Saturday she started having some vaginal bleeding which was like a menstrual cycle.  On Sunday she passed some clots, Monday she started spotting.  She is not currently bleeding.  She is questioning whether she had a miscarriage.  Has had some intermittent nausea, no vomiting.  She denies any abdominal pain, pelvic pain, syncope, back pain, discharge.   Patient has friends at bedside.  Gives permission to discuss history, exam, results with friends in room    HPI     Prior to Admission medications   Medication Sig Start Date End Date Taking? Authorizing Provider  ondansetron  (ZOFRAN -ODT) 4 MG disintegrating tablet Take 1 tablet (4 mg total) by mouth every 8 (eight) hours as needed. 05/15/24  Yes Oviya Ammar A, PA-C  cephALEXin  (KEFLEX ) 500 MG capsule 2 caps po bid x 7 days 01/04/22   Emil Share, DO  doxycycline  (VIBRA -TABS) 100 MG tablet Take 1 tablet (100 mg total) by mouth 2 (two) times daily. 04/28/22   Elnor Jayson LABOR, DO  escitalopram (LEXAPRO) 20 MG tablet Take 20 mg by mouth daily.    [provider]  etonogestrel  (NEXPLANON ) 68 MG IMPL implant 1 each by Subdermal route once.    [provider]  fluconazole  (DIFLUCAN ) 150 MG tablet Take 1 tablet (150 mg total) by mouth once a week. 09/24/20   Christopher Savannah, PA-C  hydrOXYzine  (ATARAX /VISTARIL ) 25 MG tablet Take 0.5-1 tablets (12.5-25 mg total) by mouth every 8 (eight) hours as needed for itching. 09/24/20   Christopher Savannah, PA-C  omeprazole  (PRILOSEC) 20 MG capsule Take 1 capsule (20 mg total) by mouth daily. 01/03/21   Armenta Canning, MD  pantoprazole  (PROTONIX ) 20 MG tablet Take 1 tablet (20 mg total) by mouth daily. 02/26/20   Hall-Potvin, [place], PA-C  rizatriptan (MAXALT-MLT) 5 MG disintegrating tablet PLEASE SEE ATTACHED FOR DETAILED DIRECTIONS 08/05/18   [provider]  sertraline (ZOLOFT) 100 MG tablet Take 100 mg by mouth daily.    [provider]  VALACYCLOVIR HCL PO Take by mouth.    [provider]  norgestimate -ethinyl estradiol  (ORTHO-CYCLEN) 0.25-35 MG-MCG tablet Take 1 tablet by mouth daily. 01/13/19 02/26/20  Constant, Peggy, MD  propranolol ER (INDERAL LA) 60 MG 24 hr capsule TAKE 1 CAPSULE BY MOUTH EVERY MORNING FOR PREVENTION OF MIGRAINE SYMPTOMS 06/26/18 02/26/20  [provider]    Allergies: Morphine    Review of Systems  Constitutional: Negative.   HENT: Negative.    Respiratory: Negative.    Cardiovascular: Negative.   Gastrointestinal:  Positive for nausea. Negative for abdominal pain and vomiting.  Genitourinary:  Positive for menstrual problem and vaginal bleeding (resolved). Negative for decreased urine volume, difficulty urinating, dysuria, flank pain, frequency, genital sores, hematuria, pelvic pain, urgency, vaginal discharge and vaginal pain.  Musculoskeletal: Negative.   Skin: Negative.   Neurological: Negative.   All other systems reviewed and are negative.   Updated Vital Signs BP 118/72   Pulse 74   Temp 98.2  F (36.8 C) (Oral)   Resp 16   LMP 04/08/2024 (Approximate)   SpO2 100%   Physical Exam Vitals and nursing note reviewed.  Constitutional:      General: She is not in acute distress.    Appearance: She is well-developed. She is not ill-appearing, toxic-appearing or diaphoretic.  HENT:     Head: Atraumatic.  Eyes:     Pupils: Pupils are equal, round, and reactive to light.  Cardiovascular:     Rate and Rhythm:  Normal rate.     Pulses: Normal pulses.     Heart sounds: Normal heart sounds.  Pulmonary:     Effort: Pulmonary effort is normal. No respiratory distress.     Breath sounds: Normal breath sounds.  Abdominal:     General: Bowel sounds are normal. There is no distension.     Palpations: Abdomen is soft.     Tenderness: There is no abdominal tenderness. There is no right CVA tenderness, left CVA tenderness, guarding or rebound.  Musculoskeletal:        General: Normal range of motion.     Cervical back: Normal range of motion.  Skin:    General: Skin is warm and dry.  Neurological:     General: No focal deficit present.     Mental Status: She is alert.  Psychiatric:        Mood and Affect: Mood normal.     (all labs ordered are listed, but only abnormal results are displayed) Labs Reviewed  CBC WITH DIFFERENTIAL/PLATELET - Abnormal; Notable for the following components:      Result Value   RBC 3.82 (*)    Hemoglobin 11.6 (*)    HCT 34.4 (*)    Neutro Abs 1.5 (*)    All other components within normal limits  HCG, QUANTITATIVE, PREGNANCY  URINALYSIS, ROUTINE W REFLEX MICROSCOPIC    EKG: None  Radiology: No results found.   Procedures   Medications Ordered in the ED  ondansetron  (ZOFRAN -ODT) disintegrating tablet 4 mg (4 mg Oral Given 05/15/24 1644)   22 here for evaluation of concern for pregnancy.  Had a negative urine pregnancy test at PCP however had follow-up blood work which showed a mildly positive hCG.  Had repeat blood work which showed a downtrending hCG level.  Started bleeding on Saturday, past some clots on Sunday, no longer bleeding.  She is questioning if she had a miscarriage.  Here she is asymptomatic.  Will plan on labs, reassess  Labs personally viewed and interpreted:  CBC without leukocytosis, hemoglobin Levan 0.6, similar to prior Quantitative hCG less than 1  Discussed results with patient.  Discussed negative pregnancy test at this time.   She is not bleeding, she has no abdominal pain at low suspicion for ectopic pregnancy.  I suspect she likely had a chemical pregnancy.  Will have her follow back up with her primary care provider or OB/GYN, return for new or worsening symptoms. Tolerating  PO intake here.  Patient is nontoxic, nonseptic appearing, in no apparent distress.  Patient's pain and other symptoms adequately managed in emergency department.  Fluid bolus given.  Labs, imaging and vitals reviewed.  Patient does not meet the SIRS or Sepsis criteria.  On repeat exam patient does not have a surgical abdomin and there are no peritoneal signs.  No indication of appendicitis, bowel obstruction, bowel perforation, cholecystitis, diverticulitis, PID, intermittent, persistent torsion, TOA, ectopic pregnancy, AAA, dissection, traumatic injury.  Patient discharged home with symptomatic treatment and  given strict instructions for follow-up with their primary care physician.  I have also discussed reasons to return immediately to the ER.  Patient expresses understanding and agrees with plan.                                    Medical Decision Making Amount and/or Complexity of Data Reviewed Independent Historian: friend External Data Reviewed: labs, radiology and notes. Labs: ordered. Decision-making details documented in ED Course.  Risk OTC drugs. Prescription drug management. Decision regarding hospitalization. Diagnosis or treatment significantly limited by social determinants of health.       Final diagnoses:  Negative pregnancy test    ED Discharge Orders          Ordered    ondansetron  (ZOFRAN -ODT) 4 MG disintegrating tablet  Every 8 hours PRN        05/15/24 1644               Angles Trevizo A, PA-C 05/15/24 1702    Cur[place]olo, Adam, DO 05/15/24 1726

## 2024-05-15 NOTE — ED Triage Notes (Signed)
 Pt via pov from home with vaginal bleeding since Saturday; she has had recently confirmed pregnancy - believes she is 3 weeks. Pt states she started having menstrual type bleeding but now has only spotting.  She reports that she had blood test that showed numbers were not rising as they should. She reports passing a large clot on Sunday or Monday. Pt a&o x 4; nad noted.

## 2024-05-15 NOTE — ED Notes (Signed)
Unable to provide UA

## 2024-07-04 ENCOUNTER — Encounter: Payer: Self-pay | Admitting: Obstetrics

## 2024-07-04 ENCOUNTER — Ambulatory Visit: Admitting: Obstetrics

## 2024-07-04 VITALS — BP 112/68 | HR 83 | Ht 61.0 in | Wt 129.5 lb

## 2024-07-04 DIAGNOSIS — Z30016 Encounter for initial prescription of transdermal patch hormonal contraceptive device: Secondary | ICD-10-CM | POA: Diagnosis not present

## 2024-07-04 DIAGNOSIS — D508 Other iron deficiency anemias: Secondary | ICD-10-CM

## 2024-07-04 DIAGNOSIS — Z3009 Encounter for other general counseling and advice on contraception: Secondary | ICD-10-CM | POA: Diagnosis not present

## 2024-07-04 DIAGNOSIS — Z3202 Encounter for pregnancy test, result negative: Secondary | ICD-10-CM | POA: Diagnosis not present

## 2024-07-04 LAB — POCT URINE PREGNANCY: Preg Test, Ur: NEGATIVE

## 2024-07-04 MED ORDER — VITAFOL ULTRA 29-0.6-0.4-200 MG PO CAPS: 1.0000 | ORAL_CAPSULE | Freq: Every day | ORAL | 4 refills | Status: AC

## 2024-07-04 MED ORDER — NORELGESTROMIN-ETH ESTRADIOL 150-35 MCG/24HR TD PTWK: 1.0000 | MEDICATED_PATCH | TRANSDERMAL | 12 refills | Status: AC

## 2024-07-04 MED ORDER — ACCRUFER 30 MG PO CAPS: 1.0000 | ORAL_CAPSULE | Freq: Two times a day (BID) | ORAL | 3 refills | Status: AC

## 2024-07-04 NOTE — Progress Notes (Signed)
 Subjective:    Patricia Davenport is a 23 y.o. female who presents for contraception counseling. The patient has no complaints today. The patient is sexually active. Pertinent past medical history: none.  The information documented in the HPI was reviewed and verified.  Menstrual History: OB History     Gravida  0   Para  0   Term  0   Preterm  0   AB  0   Living  0      SAB  0   IAB  0   Ectopic  0   Multiple  0   Live Births  0            Patient's last menstrual period was 06/07/2024 (approximate).   Patient Active Problem List   Diagnosis Date Noted   Nausea with vomiting 06/03/2018   Obesity 06/03/2018   Past Medical History:  Diagnosis Date   Anal fissure    Anxiety    Asthma    childhood   Eczema    GERD (gastroesophageal reflux disease)    History of anxiety    IBS (irritable bowel syndrome)     Past Surgical History:  Procedure Laterality Date   ABCESS DRAINAGE     ADENOIDECTOMY     TONSILLECTOMY     WISDOM TOOTH EXTRACTION      Current Medications[1] Allergies[2]  Social History   Tobacco Use   Smoking status: Never   Smokeless tobacco: Never  Substance Use Topics   Alcohol use: Yes    Comment: Seldom    Family History  Problem Relation Age of Onset   Thyroid disease Mother    GER disease Mother    Hypertension Father    GER disease Father    Hypertension Maternal Grandmother    Diabetes Maternal Grandmother    Hypertension Paternal Grandmother    Hypertension Paternal Grandfather    Irritable bowel syndrome Neg Hx    Colitis Neg Hx        Review of Systems Constitutional: negative for weight loss Genitourinary:negative for abnormal menstrual periods and vaginal discharge   Objective:   BP 112/68   Pulse 83   Ht 5' 1 (1.549 m)   Wt 129 lb 8 oz (58.7 kg)   LMP 06/07/2024 (Approximate)   BMI 24.47 kg/m    General:   Alert and no distress  Skin:   no rash or abnormalities  Lungs:   clear to auscultation  bilaterally  Heart:   regular rate and rhythm, S1, S2 normal, no murmur, click, rub or gallop  Breasts:   Not examined  Abdomen:  normal findings: no organomegaly, soft, non-tender and no hernia  Pelvis:  External genitalia: normal general appearance Urinary system: urethral meatus normal and bladder without fullness, nontender Vaginal: normal without tenderness, induration or masses Cervix: normal appearance Adnexa: normal bimanual exam Uterus: anteverted and non-tender, normal size   Lab Review Urine pregnancy test Labs reviewed yes Radiologic studies reviewed no   Assessment:    23 y.o., starting Xulane Transdermal Patches, no contraindications.   Plan:    All questions answered. Contraception: Xulane Transdermal Patches. Discussed healthy lifestyle modifications. Follow up in 6 months. Pregnancy test, result: negative. Meds ordered this encounter  Medications   norelgestromin-ethinyl estradiol  (XULANE) 150-35 MCG/24HR transdermal patch    Sig: Place 1 patch onto the skin once a week.    Dispense:  3 patch    Refill:  12   Prenat-Fe Poly-Methfol-FA-DHA (VITAFOL ULTRA) 29-0.6-0.4-200 MG  CAPS    Sig: Take 1 capsule by mouth daily before breakfast.    Dispense:  90 capsule    Refill:  4   Ferric Maltol (ACCRUFER) 30 MG CAPS    Sig: Take 1 capsule (30 mg total) by mouth 2 (two) times daily before a meal. Take 2 hrs before, or 2 hrs after a meal.    Dispense:  60 capsule    Refill:  3   Orders Placed This Encounter  Procedures   POCT urine pregnancy   CARLIN RONAL CENTERS, MD, FACOG Attending Obstetrician & Gynecologist, [facility] for [facility], [facility], [facility] 07/04/2024    [1]  Current Outpatient Medications:    escitalopram (LEXAPRO) 20 MG tablet, Take 20 mg by mouth daily., Disp: , Rfl:    Ferric Maltol (ACCRUFER) 30 MG CAPS, Take 1 capsule (30 mg total) by mouth 2 (two) times daily before a meal. Take 2 hrs before, or 2  hrs after a meal., Disp: 60 capsule, Rfl: 3   hydrOXYzine  (ATARAX /VISTARIL ) 25 MG tablet, Take 0.5-1 tablets (12.5-25 mg total) by mouth every 8 (eight) hours as needed for itching., Disp: 30 tablet, Rfl: 0   norelgestromin-ethinyl estradiol  (XULANE) 150-35 MCG/24HR transdermal patch, Place 1 patch onto the skin once a week., Disp: 3 patch, Rfl: 12   omeprazole  (PRILOSEC) 20 MG capsule, Take 1 capsule (20 mg total) by mouth daily., Disp: 30 capsule, Rfl: 0   ondansetron  (ZOFRAN -ODT) 4 MG disintegrating tablet, Take 1 tablet (4 mg total) by mouth every 8 (eight) hours as needed., Disp: 20 tablet, Rfl: 0   Prenat-Fe Poly-Methfol-FA-DHA (VITAFOL ULTRA) 29-0.6-0.4-200 MG CAPS, Take 1 capsule by mouth daily before breakfast., Disp: 90 capsule, Rfl: 4   cephALEXin  (KEFLEX ) 500 MG capsule, 2 caps po bid x 7 days (Patient not taking: Reported on 07/04/2024), Disp: 28 capsule, Rfl: 0   etonogestrel  (NEXPLANON ) 68 MG IMPL implant, 1 each by Subdermal route once. (Patient not taking: Reported on 07/04/2024), Disp: , Rfl:    fluconazole  (DIFLUCAN ) 150 MG tablet, Take 1 tablet (150 mg total) by mouth once a week. (Patient not taking: Reported on 07/04/2024), Disp: 4 tablet, Rfl: 0   pantoprazole  (PROTONIX ) 20 MG tablet, Take 1 tablet (20 mg total) by mouth daily. (Patient not taking: Reported on 07/04/2024), Disp: 30 tablet, Rfl: 0   rizatriptan (MAXALT-MLT) 5 MG disintegrating tablet, PLEASE SEE ATTACHED FOR DETAILED DIRECTIONS (Patient not taking: Reported on 07/04/2024), Disp: , Rfl:    sertraline (ZOLOFT) 100 MG tablet, Take 100 mg by mouth daily. (Patient not taking: Reported on 07/04/2024), Disp: , Rfl:    VALACYCLOVIR HCL PO, Take by mouth. (Patient not taking: Reported on 07/04/2024), Disp: , Rfl:  [2]  Allergies Allergen Reactions   Morphine Nausea And Vomiting

## 2024-07-04 NOTE — Progress Notes (Signed)
 Pt presents for bc consult. Pt would like the nexplanon . Advised pt that she would need to come back for the nexplanon  insert.

## 2024-07-24 ENCOUNTER — Encounter: Payer: Self-pay | Admitting: Obstetrics
